# Patient Record
Sex: Male | Born: 1975 | Race: White | Hispanic: No | Marital: Married | Smoking: Former smoker
Health system: Southern US, Community
[De-identification: ages and names within clinical notes are randomized; demographics above are authoritative.]

## PROBLEM LIST (undated history)

## (undated) DIAGNOSIS — L309 Dermatitis, unspecified: Secondary | ICD-10-CM

## (undated) DIAGNOSIS — M109 Gout, unspecified: Secondary | ICD-10-CM

## (undated) DIAGNOSIS — E669 Obesity, unspecified: Secondary | ICD-10-CM

## (undated) DIAGNOSIS — Z8669 Personal history of other diseases of the nervous system and sense organs: Secondary | ICD-10-CM

## (undated) DIAGNOSIS — E781 Pure hyperglyceridemia: Secondary | ICD-10-CM

## (undated) DIAGNOSIS — I493 Ventricular premature depolarization: Secondary | ICD-10-CM

## (undated) HISTORY — DX: Gout, unspecified: M10.9

## (undated) HISTORY — DX: Obesity, unspecified: E66.9

## (undated) HISTORY — DX: Personal history of other diseases of the nervous system and sense organs: Z86.69

## (undated) HISTORY — DX: Ventricular premature depolarization: I49.3

## (undated) HISTORY — PX: BACK SURGERY: SHX140

## (undated) HISTORY — DX: Dermatitis, unspecified: L30.9

## (undated) HISTORY — DX: Pure hyperglyceridemia: E78.1

---
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
## 1998-12-09 ENCOUNTER — Encounter: Payer: Self-pay | Admitting: Emergency Medicine

## 1998-12-09 ENCOUNTER — Emergency Department (HOSPITAL_COMMUNITY): Admission: EM | Admit: 1998-12-09 | Discharge: 1998-12-09 | Payer: Self-pay | Admitting: Emergency Medicine

## 1999-05-24 ENCOUNTER — Emergency Department (HOSPITAL_COMMUNITY): Admission: EM | Admit: 1999-05-24 | Discharge: 1999-05-24 | Payer: Self-pay

## 2001-07-08 HISTORY — PX: CARDIOVASCULAR STRESS TEST: SHX262

## 2001-08-08 DIAGNOSIS — E781 Pure hyperglyceridemia: Secondary | ICD-10-CM | POA: Insufficient documentation

## 2001-09-10 ENCOUNTER — Encounter: Payer: Self-pay | Admitting: Family Medicine

## 2001-09-10 ENCOUNTER — Encounter: Admission: RE | Admit: 2001-09-10 | Discharge: 2001-09-10 | Payer: Self-pay | Admitting: Family Medicine

## 2004-06-11 ENCOUNTER — Ambulatory Visit: Payer: Self-pay | Admitting: Family Medicine

## 2004-09-10 ENCOUNTER — Ambulatory Visit: Payer: Self-pay | Admitting: Family Medicine

## 2005-02-08 ENCOUNTER — Ambulatory Visit: Payer: Self-pay | Admitting: Family Medicine

## 2005-06-28 ENCOUNTER — Ambulatory Visit: Payer: Self-pay | Admitting: Family Medicine

## 2007-06-29 ENCOUNTER — Emergency Department (HOSPITAL_COMMUNITY): Admission: EM | Admit: 2007-06-29 | Discharge: 2007-06-29 | Payer: Self-pay | Admitting: Emergency Medicine

## 2007-09-09 ENCOUNTER — Ambulatory Visit: Payer: Self-pay | Admitting: Family Medicine

## 2007-09-09 DIAGNOSIS — B9789 Other viral agents as the cause of diseases classified elsewhere: Secondary | ICD-10-CM

## 2008-04-19 ENCOUNTER — Telehealth (INDEPENDENT_AMBULATORY_CARE_PROVIDER_SITE_OTHER): Payer: Self-pay | Admitting: Internal Medicine

## 2008-05-05 ENCOUNTER — Ambulatory Visit: Payer: Self-pay | Admitting: Family Medicine

## 2008-09-08 ENCOUNTER — Ambulatory Visit: Payer: Self-pay | Admitting: Family Medicine

## 2008-09-08 DIAGNOSIS — K219 Gastro-esophageal reflux disease without esophagitis: Secondary | ICD-10-CM | POA: Insufficient documentation

## 2008-09-22 ENCOUNTER — Telehealth (INDEPENDENT_AMBULATORY_CARE_PROVIDER_SITE_OTHER): Payer: Self-pay | Admitting: Internal Medicine

## 2008-09-26 ENCOUNTER — Encounter (INDEPENDENT_AMBULATORY_CARE_PROVIDER_SITE_OTHER): Payer: Self-pay | Admitting: Internal Medicine

## 2008-11-18 IMAGING — CR DG WRIST COMPLETE 3+V*R*
2 series · 2 of 2 positions shown · non-contrast
Comparison: none

CLINICAL DATA: 31-year-old, fell and injured wrist. 
 RIGHT WRIST - 4 VIEW:

[view not recorded (1 of 2)]
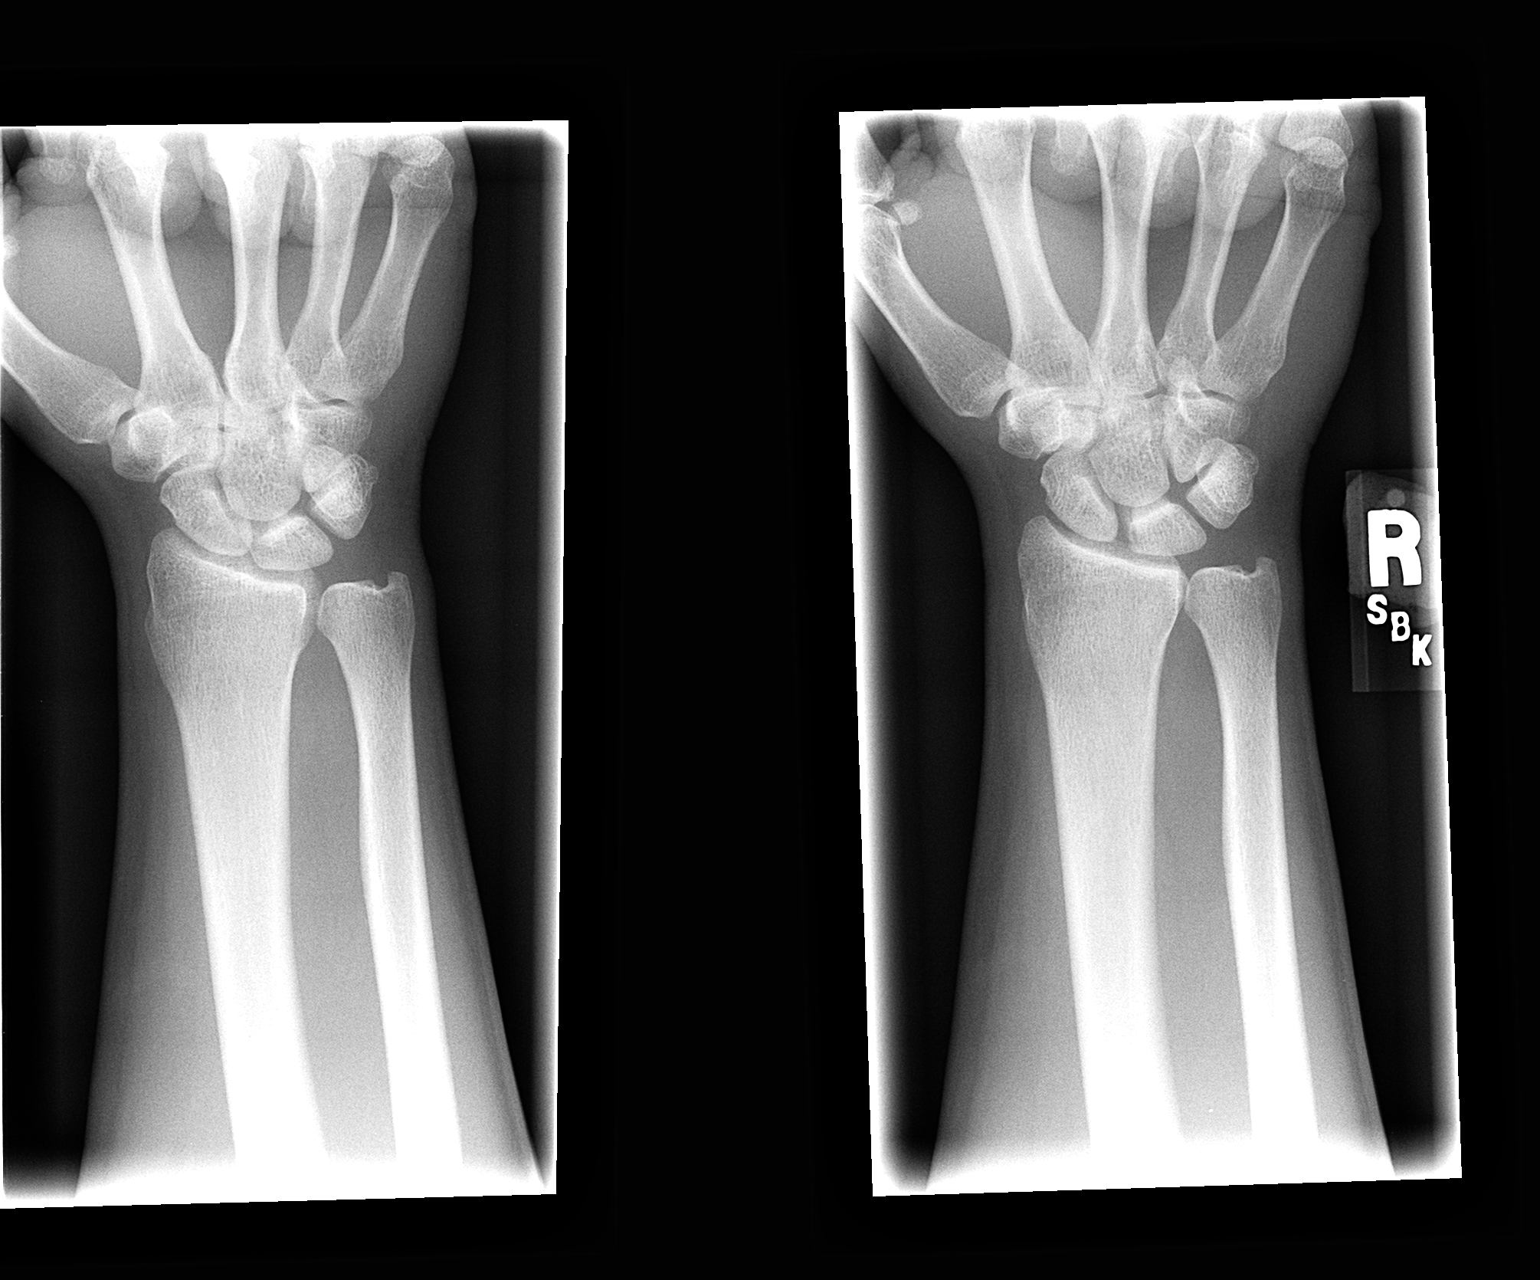

[view not recorded (2 of 2)]
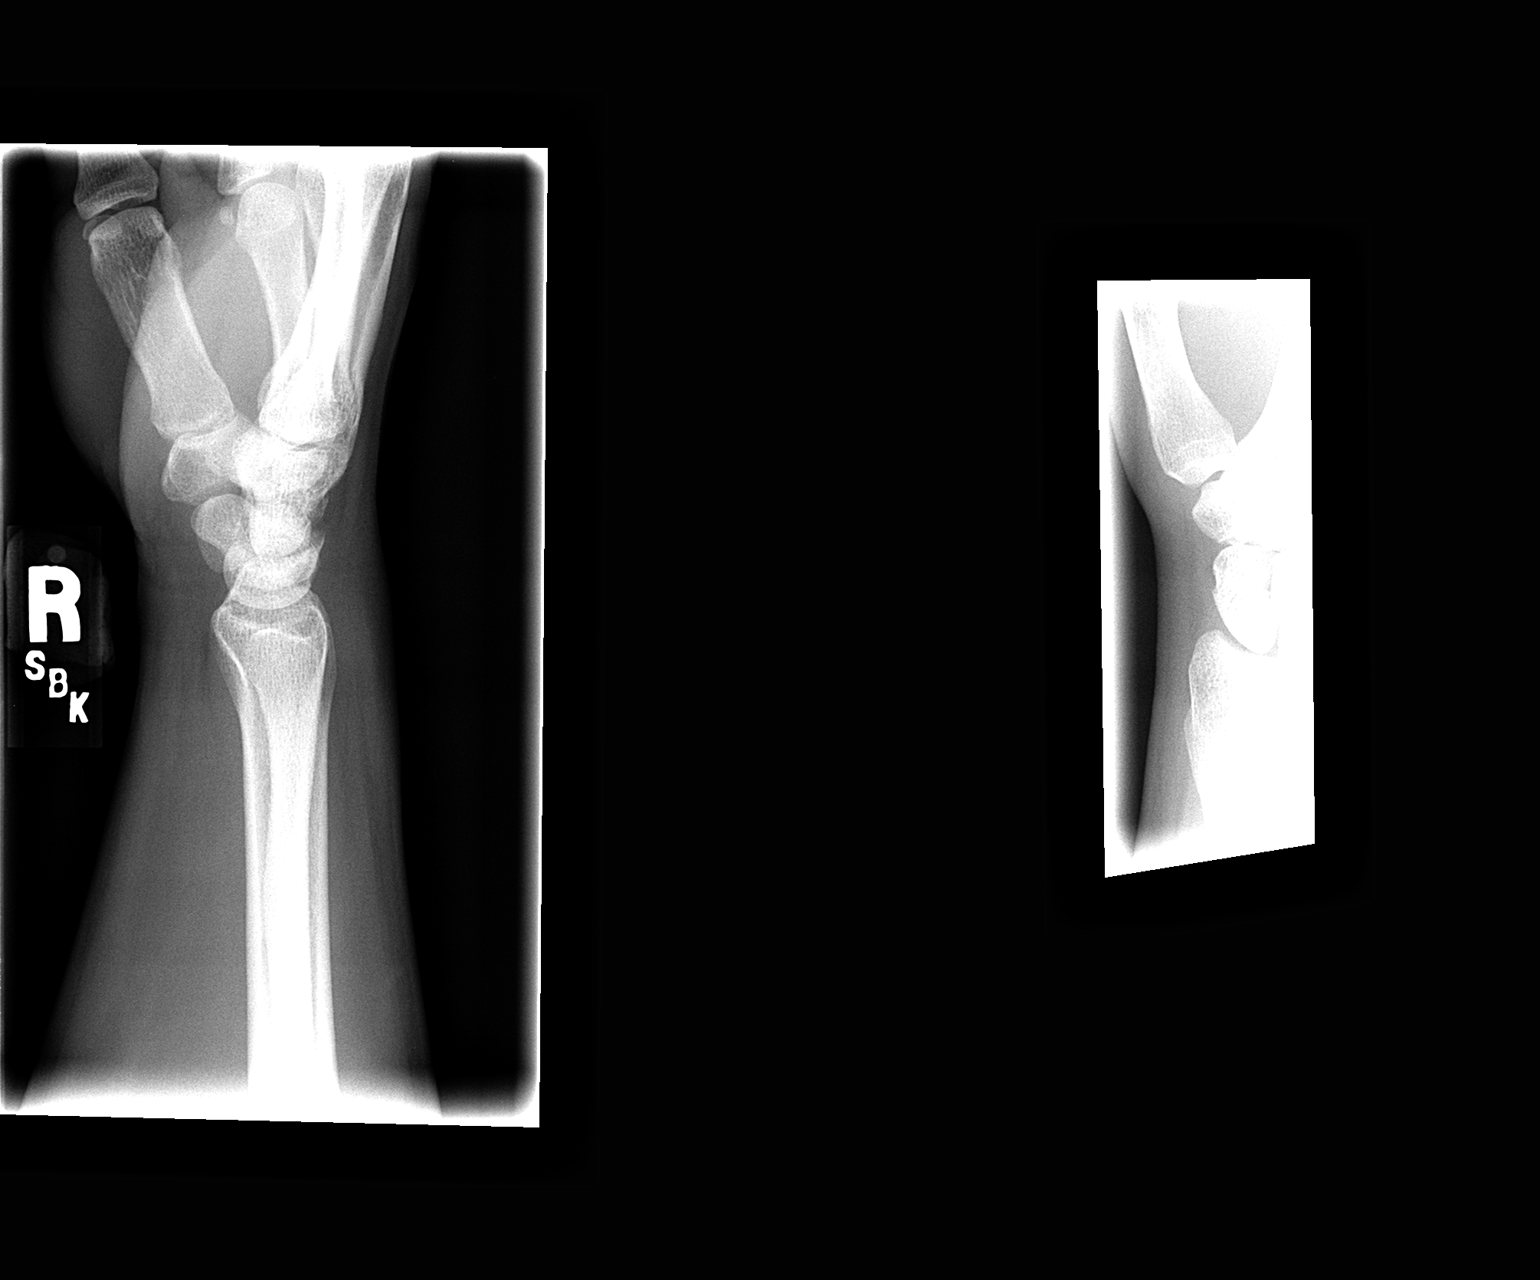

[2 of 2 positions shown; findings below may reference images not displayed]

FINDINGS: There is no evidence of fracture or dislocation.  There is no evidence of arthropathy or other focal bone abnormality.  Soft tissues are unremarkable.
IMPRESSION: Negative.

## 2010-11-23 ENCOUNTER — Inpatient Hospital Stay (INDEPENDENT_AMBULATORY_CARE_PROVIDER_SITE_OTHER)
Admission: RE | Admit: 2010-11-23 | Discharge: 2010-11-23 | Disposition: A | Discharge status: Home / Self Care | Payer: BC Managed Care – HMO | Source: Ambulatory Visit | Attending: Emergency Medicine | Admitting: Emergency Medicine

## 2010-11-23 DIAGNOSIS — T6391XA Toxic effect of contact with unspecified venomous animal, accidental (unintentional), initial encounter: Secondary | ICD-10-CM

## 2011-07-17 ENCOUNTER — Ambulatory Visit (INDEPENDENT_AMBULATORY_CARE_PROVIDER_SITE_OTHER): Payer: Self-pay | Admitting: Cardiovascular Disease

## 2011-07-17 ENCOUNTER — Encounter: Payer: Self-pay | Admitting: Cardiovascular Disease

## 2011-07-17 VITALS — BP 127/81 | HR 83 | Ht 68.0 in | Wt 216.0 lb

## 2011-07-17 DIAGNOSIS — R079 Chest pain, unspecified: Secondary | ICD-10-CM | POA: Insufficient documentation

## 2011-07-17 DIAGNOSIS — Z733 Stress, not elsewhere classified: Secondary | ICD-10-CM

## 2011-07-17 DIAGNOSIS — F439 Reaction to severe stress, unspecified: Secondary | ICD-10-CM | POA: Insufficient documentation

## 2011-07-17 DIAGNOSIS — R000 Tachycardia, unspecified: Secondary | ICD-10-CM | POA: Insufficient documentation

## 2011-07-17 MED ORDER — NITROGLYCERIN 0.4 MG SL SUBL: 0.4000 mg | SUBLINGUAL_TABLET | SUBLINGUAL | Status: DC | PRN

## 2011-07-17 NOTE — Progress Notes (Signed)
Addended by: Phoebe Sharps on: 07/17/2011 06:43 PM   Modules accepted: Level of Service

## 2011-07-17 NOTE — Patient Instructions (Signed)
Please try bystolic 10 mg (or a half) for faster heart rate and high blood pressure, as needed. Try nitro under the tongue as needed for chest pain and arm pain.  Ok to take with xanax.  Please call us if you have new issues that need to be addressed before your next appt.    \

## 2011-07-17 NOTE — Progress Notes (Signed)
   Patient ID: BERNADETTE ARMIJO, male    DOB: 1976/04/16, 36 y.o.   MRN: [id number]  HPI Comments: Mr. Glazer is a 36 year old gentleman with no known cardiac history, mild obesity who works as a Programmer, multimedia who presents for evaluation of tachycardia, chest pain and hypertension.  He believes his symptoms are secondary to stress. He reports that work is very stressful currently as the Data processing manager are criticizing his Electrical engineer. They're finding numerous paperwork issues and other issues and his boss is threatening his job.  He also reports having significant issues at home with his wife. He has had marital counseling before. They fight frequently and he is tired of fighting. They do have a six-year-old son.  He has had episodes of chest pain at work,"feeling funny" presenting to the far department. Blood pressure and EKGs were essentially normal. Symptoms seem to happen with stress.  EKG today shows normal sinus rhythm with rate 86 beats per minute with no significant ST or T wave changes   Meds: Not on medications   Review of Systems  Constitutional: Negative.   HENT: Negative.   Eyes: Negative.   Respiratory: Positive for shortness of breath.   Cardiovascular: Positive for chest pain.  Gastrointestinal: Negative.   Musculoskeletal: Negative.   Skin: Negative.   Neurological: Negative.   Hematological: Negative.   Psychiatric/Behavioral: Negative.   All other systems reviewed and are negative.    BP 127/81  Pulse 83  Ht 5\' 8"  (1.727 m)  Wt 216 lb (97.977 kg)  BMI 32.84 kg/m2  Physical Exam  Nursing note and vitals reviewed. Constitutional: He is oriented to person, place, and time. He appears well-developed and well-nourished.  HENT:  Head: Normocephalic.  Nose: Nose normal.  Mouth/Throat: Oropharynx is clear and moist.  Eyes: Conjunctivae are normal. Pupils are equal, round, and reactive to light.  Neck: Normal range of motion. Neck supple. No JVD  present.  Cardiovascular: Normal rate, regular rhythm, S1 normal, S2 normal, normal heart sounds and intact distal pulses.  Exam reveals no gallop and no friction rub.   No murmur heard. Pulmonary/Chest: Effort normal and breath sounds normal. No respiratory distress. He has no wheezes. He has no rales. He exhibits no tenderness.  Abdominal: Soft. Bowel sounds are normal. He exhibits no distension. There is no tenderness.  Musculoskeletal: Normal range of motion. He exhibits no edema and no tenderness.  Lymphadenopathy:    He has no cervical adenopathy.  Neurological: He is alert and oriented to person, place, and time. Coordination normal.  Skin: Skin is warm and dry. No rash noted. No erythema.  Psychiatric: He has a normal mood and affect. His behavior is normal. Judgment and thought content normal.           Assessment and Plan

## 2011-07-17 NOTE — Assessment & Plan Note (Signed)
Significant stress at work and at home. He is threatening to leave his wife if the arguments persists. He is stuck in his job despite looking for other jobs. He does have Xanax at home from a previous physician. We have suggested that if he has difficulty unwinding or sleeping, he could take a Xanax.

## 2011-07-17 NOTE — Assessment & Plan Note (Addendum)
Symptoms are most consistent with stress. He is young, no significant EKG changes, symptoms are very atypical in nature. He has stress at work and at home. We have suggested he could try nitroglycerin for severe chest pain radiating to his arm as he has had on more than one occasion. Symptom could be secondary to anxiety, stress, severe hypertension. Nitroglycerin sublingual may help alleviate his symptoms.  No cardiac testing is needed at this time

## 2011-07-17 NOTE — Assessment & Plan Note (Signed)
He does report having tachycardia likely secondary to stress. He does have a wrist monitor and records are reviewed in the 90-100 range on a regular basis. He is interested in medication to lower his heart rate. We have suggested he could try some samples of bystolic 5 mg, possibly titrating to 10 mg if tolerated, with close monitoring of his blood pressure.

## 2011-12-29 ENCOUNTER — Ambulatory Visit (INDEPENDENT_AMBULATORY_CARE_PROVIDER_SITE_OTHER): Payer: BC Managed Care – PPO | Admitting: Emergency Medicine

## 2011-12-29 VITALS — BP 138/74 | HR 86 | Temp 98.6°F | Resp 16 | Ht 68.5 in | Wt 222.0 lb

## 2011-12-29 DIAGNOSIS — J40 Bronchitis, not specified as acute or chronic: Secondary | ICD-10-CM

## 2011-12-29 DIAGNOSIS — J018 Other acute sinusitis: Secondary | ICD-10-CM

## 2011-12-29 MED ORDER — AMOXICILLIN-POT CLAVULANATE 875-125 MG PO TABS: 1.0000 | ORAL_TABLET | Freq: Two times a day (BID) | ORAL | Status: AC

## 2011-12-29 MED ORDER — PSEUDOEPHEDRINE-GUAIFENESIN ER 120-1200 MG PO TB12: 1.0000 | ORAL_TABLET | Freq: Two times a day (BID) | ORAL | Status: DC

## 2011-12-29 MED ORDER — HYDROCOD POLST-CHLORPHEN POLST 10-8 MG/5ML PO LQCR: 5.0000 mL | Freq: Two times a day (BID) | ORAL | Status: DC | PRN

## 2011-12-29 NOTE — Progress Notes (Signed)
Patient Name: Dylan Oliver Date of Birth: 1975/12/04 Medical Record Number: [id number] Gender: male Date of Encounter: 12/29/2011  History of Present Illness:  Dylan Oliver is a 36 y.o. very pleasant male patient who presents with the following:  Cough productive scant purulent sputum.  No fever or chills.  Post nasal drainage, foul tasting.  Pressure in maxillary sinus and congestion.  No nasal discharge.    Patient Active Problem List  Diagnosis  . VIRAL INFECTION  . HYPERLIPIDEMIA  . GERD  . Chest pain  . Tachycardia  . Stress   Past Medical History  Diagnosis Date  . Palpitations   . PVC's (premature ventricular contractions)   . Hypertriglyceridemia   . Obesity   . Family history of ischemic heart disease    Past Surgical History  Procedure Date  . Appendectomy    History  Substance Use Topics  . Smoking status: Former Smoker -- 0.5 packs/day for 4 years    Types: Cigarettes    Quit date: 07/16/1997  . Smokeless tobacco: Current User  . Alcohol Use: No   Family History  Problem Relation Age of Onset  . Hypertension Father   . Hypertension Mother   . Diabetes Father    No Known Allergies  Medication list has been reviewed and updated.  Prior to Admission medications   Medication Sig Start Date End Date Taking? Authorizing Provider  amoxicillin-clavulanate (AUGMENTIN) 875-125 MG per tablet Take 1 tablet by mouth 2 (two) times daily. 12/29/11 01/08/12  Phillips Odor, MD  chlorpheniramine-HYDROcodone (TUSSIONEX PENNKINETIC ER) 10-8 MG/5ML LQCR Take 5 mLs by mouth every 12 (twelve) hours as needed (cough). 12/29/11   Phillips Odor, MD  esomeprazole (NEXIUM) 40 MG capsule Take 40 mg by mouth daily before breakfast.    Historical Provider, MD  IBUPROFEN PO Take by mouth as needed.    Historical Provider, MD  nebivolol (BYSTOLIC) 10 MG tablet Take 1 tablet (10 mg total) by mouth daily. 07/17/11   Antonieta Iba, MD  nitroGLYCERIN (NITROSTAT) 0.4 MG SL  tablet Place 1 tablet (0.4 mg total) under the tongue every 5 (five) minutes as needed for chest pain. 07/17/11 07/16/12  Antonieta Iba, MD  omeprazole (PRILOSEC) 20 MG capsule Take 20 mg by mouth as needed.    Historical Provider, MD  Pseudoephedrine-Guaifenesin (MUCINEX D) [phone] MG TB12 Take 1 tablet by mouth 2 (two) times daily. 12/29/11   Phillips Odor, MD    Review of Systems:  As per HPI, otherwise negative.    Physical Examination: Filed Vitals:   12/29/11 1534  BP: 138/74  Pulse: 86  Temp: 98.6 F (37 C)  Resp: 16   Filed Vitals:   12/29/11 1534  Height: 5' 8.5" (1.74 m)  Weight: 222 lb (100.699 kg)   Body mass index is 33.26 kg/(m^2). Ideal Body Weight: Weight in (lb) to have BMI = 25: 166.5   GEN: WDWN, NAD, Non-toxic, A & O x 3 HEENT: Atraumatic, Normocephalic. Neck supple. No masses, No LAD. Ears and Nose: No external deformity. CV: RRR, No M/G/R. No JVD. No thrill. No extra heart sounds. PULM: CTA B, no wheezes, crackles, rhonchi. No retractions. No resp. distress. No accessory muscle use. ABD: S, NT, ND, +BS. No rebound. No HSM. EXTR: No c/c/e NEURO Normal gait.  PSYCH: Normally interactive. Conversant. Not depressed or anxious appearing.  Calm demeanor.    Assessment and Plan: Sinusitis and bronchitis meds Follow up as needed for new or worsened symptoms  Carmelina Dane, MD

## 2012-01-06 ENCOUNTER — Emergency Department (HOSPITAL_COMMUNITY)
Admission: EM | Admit: 2012-01-06 | Discharge: 2012-01-06 | Disposition: A | Discharge status: Home / Self Care | Payer: BC Managed Care – PPO | Source: Home / Self Care | Attending: Emergency Medicine | Admitting: Emergency Medicine

## 2012-01-06 ENCOUNTER — Encounter (HOSPITAL_COMMUNITY): Payer: Self-pay

## 2012-01-06 DIAGNOSIS — M109 Gout, unspecified: Secondary | ICD-10-CM

## 2012-01-06 MED ORDER — PREDNISONE 5 MG PO KIT: 1.0000 | PACK | Freq: Every day | ORAL | Status: DC

## 2012-01-06 MED ORDER — COLCHICINE 0.6 MG PO TABS: ORAL_TABLET | ORAL | Status: DC

## 2012-01-06 MED ORDER — METHYLPREDNISOLONE ACETATE 80 MG/ML IJ SUSP
INTRAMUSCULAR | Status: AC
  Filled 2012-01-06: qty 1

## 2012-01-06 MED ORDER — METHYLPREDNISOLONE ACETATE 80 MG/ML IJ SUSP
80.0000 mg | Freq: Once | INTRAMUSCULAR | Status: AC
  Administered 2012-01-06: 80 mg via INTRAMUSCULAR

## 2012-01-06 NOTE — Discharge Instructions (Signed)
Dietary treatment plays a supplementary role in treatment of gout.  Diet can be expected to decrease the uric acid level by 10 to 15%.  Often medication can effect a much more substantial reduction.  An extremely restrictive diet is not necessary, but here are a few suggestions that might help decrease the frequency of gout attacks.  The first and most important measure is to achieve and maintain a healthy body weight (BMI of 20 to 25).  It's been shown that people with a BMI over 25 have an increased risk of gout attacks compared to people with a BMI of less than 25.  Also, gout patients who lose as little as 4.5 kg or 9.9 lbs will decrease their risk of gout attacks.  Beyond that, here are a few general guidelines:  Eat less: Red meat Seafood Beer and hard alcohol (e.g. gin, vodka, whiskey) Foods that contain high fructose corn syrup (found in sweets and non-diet sodas) Organ meats (liver, kidneys, brains, sweetbreads) or foods made from these meats (hot dogs, bologna).  Eat more: Low fat dairy products A moderate amount of wine (up to two 5 oz servings per day) is not likely to increase the risk of a gout attack. Coffee may decrease the risk of gout attacks Vitamin C (500 mg per day) has a mild urate lowering effect   Gout Gout is an inflammatory condition (arthritis) caused by a buildup of uric acid crystals in the joints. Uric acid is a chemical that is normally present in the blood. Under some circumstances, uric acid can form into crystals in your joints. This causes joint redness, soreness, and swelling (inflammation). Repeat attacks are common. Over time, uric acid crystals can form into masses (tophi) near a joint, causing disfigurement. Gout is treatable and often preventable. CAUSES  The disease begins with elevated levels of uric acid in the blood. Uric acid is produced by your body when it breaks down a naturally found substance called purines. This also happens when you eat  certain foods such as meats and fish. Causes of an elevated uric acid level include:  Being passed down from parent to child (heredity).   Diseases that cause increased uric acid production (obesity, psoriasis, some cancers).   Excessive alcohol use.   Diet, especially diets rich in meat and seafood.   Medicines, including certain cancer-fighting drugs (chemotherapy), diuretics, and aspirin.   Chronic kidney disease. The kidneys are no longer able to remove uric acid well.   Problems with metabolism.  Conditions strongly associated with gout include:  Obesity.   High blood pressure.   High cholesterol.   Diabetes.  Not everyone with elevated uric acid levels gets gout. It is not understood why some people get gout and others do not. Surgery, joint injury, and eating too much of certain foods are some of the factors that can lead to gout. SYMPTOMS   An attack of gout comes on quickly. It causes intense pain with redness, swelling, and warmth in a joint.   Fever can occur.   Often, only one joint is involved. Certain joints are more commonly involved:   Base of the big toe.   Knee.   Ankle.   Wrist.   Finger.  Without treatment, an attack usually goes away in a few days to weeks. Between attacks, you usually will not have symptoms, which is different from many other forms of arthritis. DIAGNOSIS  Your caregiver will suspect gout based on your symptoms and exam. Removal of fluid  from the joint (arthrocentesis) is done to check for uric acid crystals. Your caregiver will give you a medicine that numbs the area (local anesthetic) and use a needle to remove joint fluid for exam. Gout is confirmed when uric acid crystals are seen in joint fluid, using a special microscope. Sometimes, blood, urine, and X-ray tests are also used. TREATMENT  There are 2 phases to gout treatment: treating the sudden onset (acute) attack and preventing attacks (prophylaxis). Treatment of an Acute  Attack  Medicines are used. These include anti-inflammatory medicines or steroid medicines.   An injection of steroid medicine into the affected joint is sometimes necessary.   The painful joint is rested. Movement can worsen the arthritis.   You may use warm or cold treatments on painful joints, depending which works best for you.   Discuss the use of coffee, vitamin C, or cherries with your caregiver. These may be helpful treatment options.  Treatment to Prevent Attacks After the acute attack subsides, your caregiver may advise prophylactic medicine. These medicines either help your kidneys eliminate uric acid from your body or decrease your uric acid production. You may need to stay on these medicines for a very long time. The early phase of treatment with prophylactic medicine can be associated with an increase in acute gout attacks. For this reason, during the first few months of treatment, your caregiver may also advise you to take medicines usually used for acute gout treatment. Be sure you understand your caregiver's directions. You should also discuss dietary treatment with your caregiver. Certain foods such as meats and fish can increase uric acid levels. Other foods such as dairy can decrease levels. Your caregiver can give you a list of foods to avoid. HOME CARE INSTRUCTIONS   Do not take aspirin to relieve pain. This raises uric acid levels.   Only take over-the-counter or prescription medicines for pain, discomfort, or fever as directed by your caregiver.   Rest the joint as much as possible. When in bed, keep sheets and blankets off painful areas.   Keep the affected joint raised (elevated).   Use crutches if the painful joint is in your leg.   Drink enough water and fluids to keep your urine clear or pale yellow. This helps your body get rid of uric acid. Do not drink alcoholic beverages. They slow the passage of uric acid.   Follow your caregiver's dietary instructions.  Pay careful attention to the amount of protein you eat. Your daily diet should emphasize fruits, vegetables, whole grains, and fat-free or low-fat milk products.   Maintain a healthy body weight.  SEEK MEDICAL CARE IF:   You have an oral temperature above 102 F (38.9 C).   You develop diarrhea, vomiting, or any side effects from medicines.   You do not feel better in 24 hours, or you are getting worse.  SEEK IMMEDIATE MEDICAL CARE IF:   Your joint becomes suddenly more tender and you have:   Chills.   An oral temperature above 102 F (38.9 C), not controlled by medicine.  MAKE SURE YOU:   Understand these instructions.   Will watch your condition.   Will get help right away if you are not doing well or get worse.  Document Released: 06/21/2000 Document Revised: 06/13/2011 Document Reviewed: 10/02/2009 [facility] Patient Information 2012 [place], [place].

## 2012-01-06 NOTE — ED Provider Notes (Signed)
Chief Complaint  Patient presents with  . Foot Pain    History of Present Illness:   Dylan Oliver is a 36 year old male who has had a three-day history of waxing and waning pain over the MTP joint of the right great toe. There is swelling and redness. Sometimes it hurts to walk or wear a shoe and he denies any injury to the area. No fever or chills. No prior history of similar attacks in the past. He denies any history of gout or family history of gout. He does drink lots of regular Mountain Dew sodas.  Review of Systems:  Other than noted above, the patient denies any of the following symptoms: Systemic:  No fevers, chills, sweats, or aches.  No fatigue or tiredness. Musculoskeletal:  No joint pain, arthritis, bursitis, swelling, back pain, or neck pain. Neurological:  No muscular weakness, paresthesias, headache, or trouble with speech or coordination.  No dizziness.   PMFSH:  Past medical history, family history, social history, meds, and allergies were reviewed.  Physical Exam:   Vital signs:  BP 124/82  Pulse 69  Temp 98.1 F (36.7 C) (Oral)  Resp 16  SpO2 99% Gen:  Alert and oriented times 3.  In no distress. Musculoskeletal: There was redness, swelling, and pain to palpation over the MTP joint of the right great toe. Otherwise, all joints had a full a ROM with no swelling, bruising or deformity.  No edema, pulses full. Extremities were warm and pink.  Capillary refill was brisk.  Skin:  Clear, warm and dry.  No rash. Neuro:  Alert and oriented times 3.  Muscle strength was normal.  Sensation was intact to light touch.   Other Labs Obtained at Urgent Care Center:  A uric acid levels obtained.  Results are pending at this time and we will call about any positive results.  Course in Urgent Care Center:   He was given Depo-Medrol 80 mg IM and tolerated this well without any immediate side effects.  Assessment:  The encounter diagnosis was Gout.  Plan:   1.  The following meds were  prescribed:   New Prescriptions   COLCHICINE 0.6 MG TABLET    Take 2 now and 1 in 1 hour.  May repeat dose once daily.  For gout attack.   PREDNISONE 5 MG KIT    Take 1 kit (5 mg total) by mouth daily after breakfast. Prednisone 5 mg 6 day dosepack.  Take as directed.   2.  The patient was instructed in symptomatic care, including rest and activity, elevation, application of ice and compression.  Appropriate handouts were given. 3.  The patient was told to return if becoming worse in any way, if no better in 3 or 4 days, and given some red flag symptoms that would indicate earlier return.   4.  The patient was told to follow up with a primary care physician of his choice with regard to uric acid lowering therapy.   Reuben Likes, MD 01/06/12 2032

## 2012-01-06 NOTE — ED Notes (Signed)
C/o pain in plantar surface of rt foot at the base of rt great toe.  Sx started on Saturday.  Denies injury.

## 2012-01-07 ENCOUNTER — Telehealth (HOSPITAL_COMMUNITY): Payer: Self-pay | Admitting: *Deleted

## 2012-01-07 NOTE — ED Notes (Signed)
Uric Acid 8.2 H.  Dr. Lorenz Coaster has reviewed.  I called pt. Pt. verified x 2 and given result.  Pt. told this indicates gout and he was adequately treated with the Colchicine and Prednisone. Pt. instructed to get a PCP, so he can get on long term treatment with medication that will lower his Uric Acid level and decrease outbreaks of gout. Pt. voiced understanding. Vassie Moselle 01/07/2012

## 2012-01-30 ENCOUNTER — Telehealth: Payer: Self-pay | Admitting: Family Medicine

## 2012-01-30 NOTE — Telephone Encounter (Signed)
Yes may place him in physical slot sooner.

## 2012-01-30 NOTE — Telephone Encounter (Signed)
Pt was recently diagnosed with gout and went to an urgent care b/c he was going to [facility] and he did see Everrett Coombe here at our office before that. He wanted to know if he could est with you because you were recommenced to him. He wanted to know if he could see you before your next new pt appt in late september early October.

## 2012-01-30 NOTE — Telephone Encounter (Signed)
Appt scheduled with patient

## 2012-02-10 ENCOUNTER — Ambulatory Visit: Payer: BC Managed Care – PPO | Admitting: Family Medicine

## 2012-02-17 ENCOUNTER — Other Ambulatory Visit: Payer: Self-pay | Admitting: Family Medicine

## 2012-02-17 ENCOUNTER — Encounter: Payer: Self-pay | Admitting: Radiology

## 2012-02-17 ENCOUNTER — Encounter: Payer: Self-pay | Admitting: Family Medicine

## 2012-02-17 ENCOUNTER — Ambulatory Visit (INDEPENDENT_AMBULATORY_CARE_PROVIDER_SITE_OTHER): Payer: BC Managed Care – PPO | Admitting: Family Medicine

## 2012-02-17 VITALS — BP 118/76 | HR 68 | Temp 97.8°F | Ht 68.0 in | Wt 225.0 lb

## 2012-02-17 DIAGNOSIS — E785 Hyperlipidemia, unspecified: Secondary | ICD-10-CM

## 2012-02-17 DIAGNOSIS — M109 Gout, unspecified: Secondary | ICD-10-CM

## 2012-02-17 DIAGNOSIS — Z23 Encounter for immunization: Secondary | ICD-10-CM

## 2012-02-17 DIAGNOSIS — S61219A Laceration without foreign body of unspecified finger without damage to nail, initial encounter: Secondary | ICD-10-CM

## 2012-02-17 DIAGNOSIS — S61209A Unspecified open wound of unspecified finger without damage to nail, initial encounter: Secondary | ICD-10-CM

## 2012-02-17 LAB — HEPATIC FUNCTION PANEL
ALT: 71 U/L — ABNORMAL HIGH (ref 0–53)
AST: 28 U/L (ref 0–37)
Alkaline Phosphatase: 50 U/L (ref 39–117)
Bilirubin, Direct: 0 mg/dL (ref 0.0–0.3)
Total Bilirubin: 0.5 mg/dL (ref 0.3–1.2)
Total Protein: 7.2 g/dL (ref 6.0–8.3)

## 2012-02-17 LAB — BASIC METABOLIC PANEL
CO2: 27 mEq/L (ref 19–32)
Calcium: 8.7 mg/dL (ref 8.4–10.5)
Chloride: 103 mEq/L (ref 96–112)
Glucose, Bld: 93 mg/dL (ref 70–99)
Sodium: 139 mEq/L (ref 135–145)

## 2012-02-17 LAB — LIPID PANEL
Cholesterol: 182 mg/dL (ref 0–200)
Total CHOL/HDL Ratio: 6
Triglycerides: 595 mg/dL — ABNORMAL HIGH (ref 0.0–149.0)

## 2012-02-17 MED ORDER — FENOFIBRATE 145 MG PO TABS: 145.0000 mg | ORAL_TABLET | Freq: Every day | ORAL | Status: DC

## 2012-02-17 NOTE — Assessment & Plan Note (Signed)
Fasting - check FLP today.

## 2012-02-17 NOTE — Addendum Note (Signed)
Addended by: Josph Macho A on: 02/17/2012 09:20 AM   Modules accepted: Orders

## 2012-02-17 NOTE — Assessment & Plan Note (Signed)
Small incision. Site cleaned with betadine, then benzoin/steri strips applied. Pt tolerated well. Red flags to return discussed.

## 2012-02-17 NOTE — Progress Notes (Signed)
Subjective:    Patient ID: Dylan Oliver, male    DOB: 1975/08/17, 36 y.o.   MRN: [id number]  HPI CC: new pt  Dylan Oliver is a pleasant 36 yo who presents today to establish care.  He prior saw [facility] then [facility].   Reviewed Dr. Ethelene Hal note from 07/2011.  At that time, significant work related and marital related stress.  Had xanax he used prn sleep/anxiety.  H/o chest pain, s/p evaluation 07/2011 by cards thought to be stress related, recommended try SL nitro prn pain.  Actually changed jobs, improved stress. H/o tachycardia, recommended use bystolic if needed for this.  Recently evaluated at [facility] with R MTP pain, dx with gout with uric acid elevated at 8.2.  Started on colchicine for acute flares.  Cut finger with hack saw last night.  Requests tetanus shot.  Bled for a bit, stopped with pressure.  Lives with wife, son Occupation: salesman  Medications and allergies reviewed and updated in chart.  Past histories reviewed and updated if relevant as below. Patient Active Problem List  Diagnosis  . VIRAL INFECTION  . HYPERLIPIDEMIA  . GERD  . Chest pain  . Tachycardia  . Stress   Past Medical History  Diagnosis Date  . PVC's (premature ventricular contractions)   . Hypertriglyceridemia   . Obesity   . Family history of ischemic heart disease   . Hx of migraines   . Gout    Past Surgical History  Procedure Date  . Appendectomy 1993  . Back surgery     what sounds like pilonidal cyst extraction   History  Substance Use Topics  . Smoking status: Former Smoker -- 0.5 packs/day for 4 years    Types: Cigarettes    Quit date: 07/16/1997  . Smokeless tobacco: Current User    Types: Snuff  . Alcohol Use: No   Family History  Problem Relation Age of Onset  . Hypertension Father   . Hypertension Mother   . Diabetes Father   . Coronary artery disease Maternal Grandfather 51  . Stroke Neg Hx   . Cancer Mother     skin   No Known Allergies Current  Outpatient Prescriptions on File Prior to Visit  Medication Sig Dispense Refill  . colchicine 0.6 MG tablet Take 2 now and 1 in 1 hour.  May repeat dose once daily.  For gout attack.  12 tablet  0     Review of Systems  Constitutional: Negative for fever, chills, activity change, appetite change, fatigue and unexpected weight change.  HENT: Negative for hearing loss and neck pain.   Eyes: Negative for visual disturbance.  Respiratory: Negative for cough, chest tightness, shortness of breath and wheezing.   Cardiovascular: Negative for chest pain, palpitations and leg swelling.  Gastrointestinal: Negative for nausea, vomiting, abdominal pain, diarrhea, constipation, blood in stool and abdominal distention.  Genitourinary: Negative for hematuria and difficulty urinating.  Musculoskeletal: Negative for myalgias and arthralgias.  Skin: Negative for rash.  Neurological: Negative for dizziness, seizures, syncope and headaches.  Hematological: Does not bruise/bleed easily.  Psychiatric/Behavioral: Negative for dysphoric mood. The patient is not nervous/anxious.        Objective:   Physical Exam  Nursing note and vitals reviewed. Constitutional: He is oriented to person, place, and time. He appears well-developed and well-nourished. No distress.  HENT:  Head: Normocephalic and atraumatic.  Right Ear: External ear normal.  Left Ear: External ear normal.  Nose: Nose normal.  Mouth/Throat:  Oropharynx is clear and moist. No oropharyngeal exudate.  Eyes: Conjunctivae and EOM are normal. Pupils are equal, round, and reactive to light. No scleral icterus.  Neck: Normal range of motion. Neck supple.  Cardiovascular: Normal rate, regular rhythm, normal heart sounds and intact distal pulses.   No murmur heard. Pulses:      Radial pulses are 2+ on the right side, and 2+ on the left side.  Pulmonary/Chest: Effort normal and breath sounds normal. No respiratory distress. He has no wheezes. He has no  rales.  Abdominal: Soft. Bowel sounds are normal. He exhibits no distension and no mass. There is no tenderness. There is no rebound and no guarding.  Musculoskeletal: Normal range of motion.       R index finger at palmar PIP joint with 1cm lac, no bleeding Able to flex/extend at joints  Lymphadenopathy:    He has no cervical adenopathy.  Neurological: He is alert and oriented to person, place, and time.       CN grossly intact, station and gait intact  Skin: Skin is warm and dry. No rash noted.  Psychiatric: He has a normal mood and affect. His behavior is normal. Judgment and thought content normal.       Assessment & Plan:

## 2012-02-17 NOTE — Patient Instructions (Addendum)
Good to meet you today.  Call us with questions. As you're fasting, we will check some blood work today to check cholesterol levels. Tdap today. Keep finger clean, steri strips should fall off.  Don't soak hand;.  Gout Gout is an inflammatory condition (arthritis) caused by a buildup of uric acid crystals in the joints. Uric acid is a chemical that is normally present in the blood. Under some circumstances, uric acid can form into crystals in your joints. This causes joint redness, soreness, and swelling (inflammation). Repeat attacks are common. Over time, uric acid crystals can form into masses (tophi) near a joint, causing disfigurement. Gout is treatable and often preventable. CAUSES  The disease begins with elevated levels of uric acid in the blood. Uric acid is produced by your body when it breaks down a naturally found substance called purines. This also happens when you eat certain foods such as meats and fish. Causes of an elevated uric acid level include:  Being passed down from parent to child (heredity).   Diseases that cause increased uric acid production (obesity, psoriasis, some cancers).   Excessive alcohol use.   Diet, especially diets rich in meat and seafood.   Medicines, including certain cancer-fighting drugs (chemotherapy), diuretics, and aspirin.   Chronic kidney disease. The kidneys are no longer able to remove uric acid well.   Problems with metabolism.  Conditions strongly associated with gout include:  Obesity.   High blood pressure.   High cholesterol.   Diabetes.  Not everyone with elevated uric acid levels gets gout. It is not understood why some people get gout and others do not. Surgery, joint injury, and eating too much of certain foods are some of the factors that can lead to gout. SYMPTOMS   An attack of gout comes on quickly. It causes intense pain with redness, swelling, and warmth in a joint.   Fever can occur.   Often, only one joint is  involved. Certain joints are more commonly involved:   Base of the big toe.   Knee.   Ankle.   Wrist.   Finger.  Without treatment, an attack usually goes away in a few days to weeks. Between attacks, you usually will not have symptoms, which is different from many other forms of arthritis. DIAGNOSIS  Your caregiver will suspect gout based on your symptoms and exam. Removal of fluid from the joint (arthrocentesis) is done to check for uric acid crystals. Your caregiver will give you a medicine that numbs the area (local anesthetic) and use a needle to remove joint fluid for exam. Gout is confirmed when uric acid crystals are seen in joint fluid, using a special microscope. Sometimes, blood, urine, and X-ray tests are also used. TREATMENT  There are 2 phases to gout treatment: treating the sudden onset (acute) attack and preventing attacks (prophylaxis). Treatment of an Acute Attack  Medicines are used. These include anti-inflammatory medicines or steroid medicines.   An injection of steroid medicine into the affected joint is sometimes necessary.   The painful joint is rested. Movement can worsen the arthritis.   You may use warm or cold treatments on painful joints, depending which works best for you.   Discuss the use of coffee, vitamin C, or cherries with your caregiver. These may be helpful treatment options.  Treatment to Prevent Attacks After the acute attack subsides, your caregiver may advise prophylactic medicine. These medicines either help your kidneys eliminate uric acid from your body or decrease your uric acid production.  You may need to stay on these medicines for a very long time. The early phase of treatment with prophylactic medicine can be associated with an increase in acute gout attacks. For this reason, during the first few months of treatment, your caregiver may also advise you to take medicines usually used for acute gout treatment. Be sure you understand your  caregiver's directions. You should also discuss dietary treatment with your caregiver. Certain foods such as meats and fish can increase uric acid levels. Other foods such as dairy can decrease levels. Your caregiver can give you a list of foods to avoid. HOME CARE INSTRUCTIONS   Do not take aspirin to relieve pain. This raises uric acid levels.   Only take over-the-counter or prescription medicines for pain, discomfort, or fever as directed by your caregiver.   Rest the joint as much as possible. When in bed, keep sheets and blankets off painful areas.   Keep the affected joint raised (elevated).   Use crutches if the painful joint is in your leg.   Drink enough water and fluids to keep your urine clear or pale yellow. This helps your body get rid of uric acid. Do not drink alcoholic beverages. They slow the passage of uric acid.   Follow your caregiver's dietary instructions. Pay careful attention to the amount of protein you eat. Your daily diet should emphasize fruits, vegetables, whole grains, and fat-free or low-fat milk products.   Maintain a healthy body weight.  SEEK MEDICAL CARE IF:   You have an oral temperature above 102 F (38.9 C).   You develop diarrhea, vomiting, or any side effects from medicines.   You do not feel better in 24 hours, or you are getting worse.  SEEK IMMEDIATE MEDICAL CARE IF:   Your joint becomes suddenly more tender and you have:   Chills.   An oral temperature above 102 F (38.9 C), not controlled by medicine.  MAKE SURE YOU:   Understand these instructions.   Will watch your condition.   Will get help right away if you are not doing well or get worse.  Document Released: 06/21/2000 Document Revised: 06/13/2011 Document Reviewed: 10/02/2009 [facility] Patient Information 2012 [place], [place].

## 2012-02-17 NOTE — Assessment & Plan Note (Signed)
Monitor for now, use colchicine prn. If rpt flare, start allopurinol. Discussed dietary changes and gout etiology.

## 2012-04-24 ENCOUNTER — Ambulatory Visit (INDEPENDENT_AMBULATORY_CARE_PROVIDER_SITE_OTHER): Payer: BC Managed Care – PPO

## 2012-04-24 ENCOUNTER — Telehealth: Payer: Self-pay

## 2012-04-24 VITALS — BP 110/60 | HR 66 | Ht 68.0 in | Wt 223.2 lb

## 2012-04-24 DIAGNOSIS — R002 Palpitations: Secondary | ICD-10-CM

## 2012-04-24 MED ORDER — NEBIVOLOL HCL 10 MG PO TABS: 5.0000 mg | ORAL_TABLET | Freq: Every day | ORAL | Status: DC

## 2012-04-24 NOTE — Telephone Encounter (Signed)
Pt called back Says he had an episode 2 nights ago where he felt as if his heart "stopped" then restarted He says he then felt his heart beating more irregular last night Today he feels heart is beating more regularly but states, "I just dont feel right".   He has not been taking Bystolic as prescribed by Dr. Mariah Milling in January, since May 2013 Says he ran out of samples Advised ok to come in for EKG/BP check but I explained Dr. Mariah Milling not in office to see pt Pt verb. Understanding and is ok with EKG/BP check only Coming in today at 2 pm

## 2012-04-24 NOTE — Progress Notes (Signed)
Pt here for EKG/BP check  c/o palpitations last PM and an episode of feeling as if "my heart stopped" 2 nights ago He says he has worn a holter monitor is the past (we do not have record of this) and it showed what he is describing as PVC's. He used to take Bystolic 10 mg daily, at Dr. Windell Hummingbird instruction, but ran out of samples and has been out x 5 months  EKG was NSR with a rate of 66 BPM, no PVCs BP=110/60 I reassured pt EKG is normal, BP is normal. I advised him to resume Bystolic 10 mg , take 1/2 tablet daily and f/u with Dr. Mariah Milling next week. Understanding verbalized and samples of Bystolic given to pt  He will call us sooner than appt should he develop different/worsening symptoms

## 2012-04-24 NOTE — Telephone Encounter (Signed)
Patient c/o rapid irregular heart beats with some dizziness for several days. He is feeling weak with feeling lightheaded now but heart rate feels back to being normal. He has not taken the Bystolic. He does not know his blood pressure or heart rate.  Please contact patient for what to do.

## 2012-04-24 NOTE — Telephone Encounter (Signed)
LMTCB

## 2012-04-30 ENCOUNTER — Ambulatory Visit (INDEPENDENT_AMBULATORY_CARE_PROVIDER_SITE_OTHER): Payer: BC Managed Care – PPO | Admitting: Cardiovascular Disease

## 2012-04-30 ENCOUNTER — Encounter: Payer: Self-pay | Admitting: Cardiovascular Disease

## 2012-04-30 VITALS — BP 100/62 | HR 52 | Ht 68.0 in | Wt 226.8 lb

## 2012-04-30 DIAGNOSIS — R002 Palpitations: Secondary | ICD-10-CM

## 2012-04-30 DIAGNOSIS — E785 Hyperlipidemia, unspecified: Secondary | ICD-10-CM

## 2012-04-30 NOTE — Assessment & Plan Note (Signed)
We have suggested that he work on his weight, watch his diet. He is currently on TriCor. He could recheck his lab work in 6 months time.

## 2012-04-30 NOTE — Progress Notes (Signed)
   Patient ID: Dylan Oliver, male    DOB: 05/03/1976, 36 y.o.   MRN: [id number]  HPI Comments: Dylan Oliver is a 36 year old gentleman with no known cardiac history, mild obesity, prior symptoms of tachycardia, chest pain seen in January 2013.  He had significant stress on his last clinic visit. This improved after his job change. He reports that stress has returned at work, still some stress at home. He does report several episodes last week of palpitations, a very uncomfortable feeling in his chest like his heart had stopped. Also associated with a dizzy episode. Happened at rest and with minimal exertion on several occasions.  He be started low dose bystolic with no further symptoms.   Recent lab work shows elevated triglycerides greater than 500, total cholesterol 180, LDL 90s  EKG today shows normal sinus rhythm with rate 52 beats per minute with no significant ST or T wave changes   Outpatient Encounter Prescriptions as of 04/30/2012  Medication Sig Dispense Refill  . colchicine 0.6 MG tablet Take 2 now and 1 in 1 hour.  May repeat dose once as needed.  For gout attack.      . fenofibrate (TRICOR) 145 MG tablet Take 1 tablet (145 mg total) by mouth daily.  30 tablet  12  . ibuprofen (ADVIL,MOTRIN) 600 MG tablet Take by mouth every 6 (six) hours as needed.      . nebivolol (BYSTOLIC) 10 MG tablet Take 0.5 tablets (5 mg total) by mouth daily.  21 tablet  0    Review of Systems  Constitutional: Negative.   HENT: Negative.   Eyes: Negative.   Cardiovascular: Positive for palpitations.  Gastrointestinal: Negative.   Musculoskeletal: Negative.   Skin: Negative.   Neurological: Positive for dizziness.  Hematological: Negative.   Psychiatric/Behavioral: Negative.   All other systems reviewed and are negative.   BP 100/62  Pulse 52  Ht 5\' 8"  (1.727 m)  Wt 226 lb 12 oz (102.853 kg)  BMI 34.48 kg/m2  Physical Exam  Nursing note and vitals reviewed. Constitutional: He is oriented to  person, place, and time. He appears well-developed and well-nourished.  HENT:  Head: Normocephalic.  Nose: Nose normal.  Mouth/Throat: Oropharynx is clear and moist.  Eyes: Conjunctivae normal are normal. Pupils are equal, round, and reactive to light.  Neck: Normal range of motion. Neck supple. No JVD present.  Cardiovascular: Normal rate, regular rhythm, S1 normal, S2 normal, normal heart sounds and intact distal pulses.  Exam reveals no gallop and no friction rub.   No murmur heard. Pulmonary/Chest: Effort normal and breath sounds normal. No respiratory distress. He has no wheezes. He has no rales. He exhibits no tenderness.  Abdominal: Soft. Bowel sounds are normal. He exhibits no distension. There is no tenderness.  Musculoskeletal: Normal range of motion. He exhibits no edema and no tenderness.  Lymphadenopathy:    He has no cervical adenopathy.  Neurological: He is alert and oriented to person, place, and time. Coordination normal.  Skin: Skin is warm and dry. No rash noted. No erythema.  Psychiatric: He has a normal mood and affect. His behavior is normal. Judgment and thought content normal.           Assessment and Plan

## 2012-04-30 NOTE — Assessment & Plan Note (Signed)
He has symptomatic palpitations, unable to exclude other arrhythmia. No episodes in the past week since starting low-dose beta blocker. Only several episodes of symptoms recently. We have suggested if symptoms recur, that he call our office for Holter monitor. He could pick this up in the office or this could be sent to him. He has suggested he cut his beta blocker in half and take 5 mg as his blood pressure and heart rate are low.  if he has additional symptoms, he can take the other half.

## 2012-04-30 NOTE — Patient Instructions (Addendum)
You are doing well. No medication changes were made.  Please call us if you have new issues that need to be addressed before your next appt.    

## 2012-05-06 NOTE — Patient Instructions (Addendum)
EKG is normal, BP is normal.   resume Bystolic 10 mg , take 1/2 tablet daily and f/u with Dr. Mariah Milling next week.    call us sooner than appt should he develop different/worsening symptoms

## 2012-05-06 NOTE — Addendum Note (Signed)
Addended by: Antonieta Iba on: 05/06/2012 12:37 AM   Modules accepted: Level of Service

## 2012-05-11 ENCOUNTER — Ambulatory Visit (INDEPENDENT_AMBULATORY_CARE_PROVIDER_SITE_OTHER): Payer: BC Managed Care – PPO | Admitting: Family Medicine

## 2012-05-11 ENCOUNTER — Encounter: Payer: Self-pay | Admitting: Family Medicine

## 2012-05-11 ENCOUNTER — Encounter: Payer: Self-pay | Admitting: *Deleted

## 2012-05-11 VITALS — BP 108/70 | HR 48 | Temp 98.0°F | Wt 223.2 lb

## 2012-05-11 DIAGNOSIS — J209 Acute bronchitis, unspecified: Secondary | ICD-10-CM

## 2012-05-11 DIAGNOSIS — J069 Acute upper respiratory infection, unspecified: Secondary | ICD-10-CM | POA: Insufficient documentation

## 2012-05-11 MED ORDER — GUAIFENESIN-CODEINE 100-10 MG/5ML PO SYRP: 5.0000 mL | ORAL_SOLUTION | Freq: Every evening | ORAL | Status: DC | PRN

## 2012-05-11 MED ORDER — COLCHICINE 0.6 MG PO TABS: ORAL_TABLET | ORAL | Status: DC

## 2012-05-11 NOTE — Progress Notes (Signed)
  Subjective:    Patient ID: Dylan Oliver, male    DOB: 01-03-1976, 36 y.o.   MRN: [id number]  HPI CC: cough  3-4 d h/o coughing productive of green/yellow mucous, trouble sleeping at night 2/2 cough.  Purulent mucous in AM.  Feels congested in head.  + PNdrainage leading to cough.  Coughing fits at night.  Vomited this morning - mucous.  Some dizziness from head congestion.  + HA.  + coughing fits.  Needing to sleep sitting up.  So far has tried hycodan cough syrup which did help.  Makes him too drowsy next day.  Has taken sudafed.  No fevers/chills, abd pain, ear or tooth pain, no post tussive emesis.  No sick contacts at home.  No smokers at home.  No h/o asthma.  Past Medical History  Diagnosis Date  . PVC's (premature ventricular contractions)   . Hypertriglyceridemia   . Obesity   . Hx of migraines   . Gout      Review of Systems Per HPI    Objective:   Physical Exam  Nursing note and vitals reviewed. Constitutional: He appears well-developed and well-nourished. No distress.  HENT:  Head: Normocephalic and atraumatic.  Right Ear: Hearing, tympanic membrane, external ear and ear canal normal.  Left Ear: Hearing, tympanic membrane, external ear and ear canal normal.  Nose: No mucosal edema or rhinorrhea. Right sinus exhibits maxillary sinus tenderness (mild). Right sinus exhibits no frontal sinus tenderness. Left sinus exhibits maxillary sinus tenderness (mild). Left sinus exhibits no frontal sinus tenderness.  Mouth/Throat: Uvula is midline, oropharynx is clear and moist and mucous membranes are normal. No oropharyngeal exudate, posterior oropharyngeal edema, posterior oropharyngeal erythema or tonsillar abscesses.  Eyes: Conjunctivae normal and EOM are normal. Pupils are equal, round, and reactive to light. No scleral icterus.  Neck: Normal range of motion. Neck supple.  Cardiovascular: Normal rate, regular rhythm, normal heart sounds and intact distal pulses.   No murmur  heard. Pulmonary/Chest: Effort normal and breath sounds normal. No respiratory distress. He has no wheezes. He has no rales.  Lymphadenopathy:    He has no cervical adenopathy.  Skin: Skin is warm and dry.       Beginnings of xanthelasma under bilateral eyes       Assessment & Plan:

## 2012-05-11 NOTE — Assessment & Plan Note (Signed)
Anticipate sinusitis with PNDrainage leading to irritation of bronchial passages and cough.  Given early on, anticipate more of a viral process. Update [place] if sxs persist into end of week for augmentin course. For now, treat with cheratussin and mucinex and rest/fluid. Discussed red flags to return. Pt agrees with plan.

## 2012-05-11 NOTE — Patient Instructions (Signed)
I do think you have upper respiratory infection, possibly developing sinus infection.  Given early on, anticipate more of a viral process. Note for out of work for next 2 days - return to work Thursday. Please let us know if not improving as expected or fever >101 or worsening productive cough. Use simple mucinex or immediate release guaifenesin with plenty of water to mobilize mucous. If symptoms worsening, or going on into end of this week, call me with an update.

## 2012-08-03 ENCOUNTER — Ambulatory Visit (INDEPENDENT_AMBULATORY_CARE_PROVIDER_SITE_OTHER): Payer: BC Managed Care – PPO | Admitting: Family Medicine

## 2012-08-03 ENCOUNTER — Encounter: Payer: Self-pay | Admitting: Family Medicine

## 2012-08-03 VITALS — BP 118/90 | HR 96 | Temp 98.5°F | Wt 224.5 lb

## 2012-08-03 DIAGNOSIS — J069 Acute upper respiratory infection, unspecified: Secondary | ICD-10-CM

## 2012-08-03 DIAGNOSIS — E781 Pure hyperglyceridemia: Secondary | ICD-10-CM

## 2012-08-03 MED ORDER — DOXYCYCLINE HYCLATE 100 MG PO CAPS: 100.0000 mg | ORAL_CAPSULE | Freq: Two times a day (BID) | ORAL | Status: DC

## 2012-08-03 MED ORDER — FENOFIBRATE 145 MG PO TABS: 145.0000 mg | ORAL_TABLET | Freq: Every day | ORAL | Status: DC

## 2012-08-03 MED ORDER — GUAIFENESIN-CODEINE 100-10 MG/5ML PO SYRP: 5.0000 mL | ORAL_SOLUTION | Freq: Two times a day (BID) | ORAL | Status: DC | PRN

## 2012-08-03 NOTE — Patient Instructions (Addendum)
Sounds like you have a viral sinusitis. Viral infections usually take 7-10 days to resolve.  The cough can last several weeks to go away. Use medication as prescribed: cheratussin for cough at night. Prescription for doxycycline provided in case worsening or not improving as expected. May continue simple mucinex with fluids, ibuprofen as well. Push fluids and plenty of rest. Let us know if you are not improving as expected, or if you have high fevers (>101.5) or difficulty swallowing or worsening productive cough. Call clinic with questions.  Good to see you today. Triglycerides remaining elevated - restart fenofibrate

## 2012-08-03 NOTE — Assessment & Plan Note (Addendum)
Anticipate viral URI or viral sinusitis. Treat with cough syrup at night. If progressively worsening, fill doxycycline. Refilled cheratussin.

## 2012-08-03 NOTE — Assessment & Plan Note (Addendum)
Discussed importance to treat hypertriglyceridemia. Declines cheaper gemfibrozil. Sent in 3 mo supply at a time of tricor.

## 2012-08-03 NOTE — Progress Notes (Signed)
  Subjective:    Patient ID: Dylan Oliver, male    DOB: 1976-01-25, 37 y.o.   MRN: [id number]  HPI CC: cough, congestion  5 d h/o rhinorrhea, coryza, head congestion, dry cough.  Worst night last night.  Today feeling some better.  Some muffled hearing.  + HA - R frontal pressure.  Taking mucinex so far.  No fevers/chills, abd pain, nausea, ear or tooth pain.  Mother, father sick at home. No smokers at home. No h/o asthma/COPD.  Got laid off.  Insurance runs out next week.  Wanted to be evaluated this week.  H/o PVCs was on bystolic - stopped 2/2 fatigue.  Denies palpitation sxs. Hypertriglyceridemia - not compliant with tricor - financial concerns.  Past Medical History  Diagnosis Date  . PVC's (premature ventricular contractions)   . Hypertriglyceridemia   . Obesity   . Hx of migraines   . Gout      Review of Systems Per HPI    Objective:   Physical Exam  Nursing note and vitals reviewed. Constitutional: He appears well-developed and well-nourished. No distress.  HENT:  Head: Normocephalic and atraumatic.  Right Ear: Hearing, tympanic membrane, external ear and ear canal normal.  Left Ear: Hearing, tympanic membrane, external ear and ear canal normal.  Nose: Mucosal edema (turbinate swelling and irritation evident) and rhinorrhea present. Right sinus exhibits no maxillary sinus tenderness and no frontal sinus tenderness. Left sinus exhibits frontal sinus tenderness (mild). Left sinus exhibits no maxillary sinus tenderness.  Mouth/Throat: Uvula is midline and mucous membranes are normal. Posterior oropharyngeal erythema present. No oropharyngeal exudate, posterior oropharyngeal edema or tonsillar abscesses.  Eyes: Conjunctivae normal and EOM are normal. Pupils are equal, round, and reactive to light. No scleral icterus.  Neck: Normal range of motion. Neck supple.  Cardiovascular: Normal rate, regular rhythm, normal heart sounds and intact distal pulses.   No murmur  heard. Pulmonary/Chest: Effort normal and breath sounds normal. No respiratory distress. He has no wheezes. He has no rales.  Lymphadenopathy:    He has no cervical adenopathy.  Skin: Skin is warm and dry. No rash noted.       Assessment & Plan:

## 2012-08-11 ENCOUNTER — Other Ambulatory Visit: Payer: BC Managed Care – PPO

## 2012-12-26 ENCOUNTER — Ambulatory Visit (INDEPENDENT_AMBULATORY_CARE_PROVIDER_SITE_OTHER): Payer: BC Managed Care – PPO | Admitting: Family Medicine

## 2012-12-26 VITALS — BP 118/66 | HR 55 | Temp 98.3°F | Resp 14 | Ht 68.0 in | Wt 213.0 lb

## 2012-12-26 DIAGNOSIS — L0291 Cutaneous abscess, unspecified: Secondary | ICD-10-CM

## 2012-12-26 DIAGNOSIS — L0390 Cellulitis, unspecified: Secondary | ICD-10-CM

## 2012-12-26 MED ORDER — DOXYCYCLINE HYCLATE 100 MG PO CAPS: 100.0000 mg | ORAL_CAPSULE | Freq: Two times a day (BID) | ORAL | Status: DC

## 2012-12-26 NOTE — Progress Notes (Signed)
37 yo with 3 days of left foot ulceration and red streaks.  Last dT:  2013  Patient also has a one-week history of what he thinks is poison ivy on his arms and buttocks. 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  Objective:  NAD 1 cm superficial ulcer dorsal left foot.  No edema.  Red streaks running up leg.  Multiple small 1-2 mm papules on forearms, sparing interdigital spaces, which are been excoriated. Examination of upper gluteal area reveals erythematous rough skin consistent with heat rash  Assessment:  Cellulitis of foot. Poison ivy and heat rash  Plan: Cellulitis - Plan: doxycycline (VIBRAMYCIN) 100 MG capsule Prednisone 20 ii qd x 5 with food Signed, Elvina Sidle, MD

## 2012-12-26 NOTE — Patient Instructions (Addendum)
Cellulitis Cellulitis is an infection of the skin and the tissue beneath it. The infected area is usually red and tender. Cellulitis occurs most often in the arms and lower legs.  CAUSES  Cellulitis is caused by bacteria that enter the skin through cracks or cuts in the skin. The most common types of bacteria that cause cellulitis are Staphylococcus and Streptococcus. SYMPTOMS   Redness and warmth.  Swelling.  Tenderness or pain.  Fever. DIAGNOSIS  Your caregiver can usually determine what is wrong based on a physical exam. Blood tests may also be done. TREATMENT  Treatment usually involves taking an antibiotic medicine. HOME CARE INSTRUCTIONS   Take your antibiotics as directed. Finish them even if you start to feel better.  Keep the infected arm or leg elevated to reduce swelling.  Apply a warm cloth to the affected area up to 4 times per day to relieve pain.  Only take over-the-counter or prescription medicines for pain, discomfort, or fever as directed by your caregiver.  Keep all follow-up appointments as directed by your caregiver. SEEK MEDICAL CARE IF:   You notice red streaks coming from the infected area.  Your red area gets larger or turns dark in color.  Your bone or joint underneath the infected area becomes painful after the skin has healed.  Your infection returns in the same area or another area.  You notice a swollen bump in the infected area.  You develop new symptoms. SEEK IMMEDIATE MEDICAL CARE IF:   You have a fever.  You feel very sleepy.  You develop vomiting or diarrhea.  You have a general ill feeling (malaise) with muscle aches and pains. MAKE SURE YOU:   Understand these instructions.  Will watch your condition.  Will get help right away if you are not doing well or get worse. Document Released: 04/03/2005 Document Revised: 12/24/2011 Document Reviewed: 09/09/2011 [employer] Patient Information 2014 [employer].  

## 2013-06-25 ENCOUNTER — Encounter: Payer: Self-pay | Admitting: Cardiovascular Disease

## 2013-06-25 ENCOUNTER — Ambulatory Visit (INDEPENDENT_AMBULATORY_CARE_PROVIDER_SITE_OTHER): Payer: BC Managed Care – PPO | Admitting: Cardiovascular Disease

## 2013-06-25 VITALS — BP 126/82 | HR 82 | Ht 68.0 in | Wt 221.0 lb

## 2013-06-25 DIAGNOSIS — R079 Chest pain, unspecified: Secondary | ICD-10-CM | POA: Insufficient documentation

## 2013-06-25 DIAGNOSIS — E781 Pure hyperglyceridemia: Secondary | ICD-10-CM

## 2013-06-25 DIAGNOSIS — R002 Palpitations: Secondary | ICD-10-CM

## 2013-06-25 DIAGNOSIS — K219 Gastro-esophageal reflux disease without esophagitis: Secondary | ICD-10-CM

## 2013-06-25 NOTE — Assessment & Plan Note (Signed)
Atypical chest pain. Low risk for underlying coronary artery disease. We have offered stress echo. He did one of these several years ago. He does not think it is cardiac. We suggested he could try NSAIDs as well as proton pump inhibitors. Call our office if symptoms do not improve. 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

## 2013-06-25 NOTE — Progress Notes (Signed)
Patient ID: Dylan Oliver, male    DOB: 19-Aug-1975, 37 y.o.   MRN: [id number]  HPI Comments: Mr. Weida is a 37 year old gentleman with no known cardiac history, mild obesity, prior symptoms of tachycardia, chest pain seen in January 2013.  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 No significant symptoms of chest pain with exertion such as processing and animal or at work. He does report having point specific chest pain on the left side of his mediastinum over the past 2 weeks. Seems to come and go, 4-5/10 in intensity. This can come on at rest, sometimes with exertion. Does not seem to have any rhyme or reason. Pain feels deep.  Also had episodes of nausea recently, 3 episodes in the past several days. Denies any gastroenteritis symptoms, not constipated, no GERD-type symptoms  Triglycerides very elevated in the past. He did not want to take a fenofibrate Cholesterol 180s No longer take diastolic but does have occasional palpitations and skipping.   EKG today shows normal sinus rhythm with rate 82 beats per minute with no significant ST or T wave changes   Outpatient Encounter Prescriptions as of 06/25/2013  Medication Sig  . colchicine 0.6 MG tablet Take 2 initially, then may take one pill twice daily as needed for gout attack.  Marland Kitchen ibuprofen (ADVIL,MOTRIN) 600 MG tablet Take by mouth every 6 (six) hours as needed.  . nebivolol (BYSTOLIC) 10 MG tablet Take 5 mg by mouth daily.   . [DISCONTINUED] doxycycline (VIBRAMYCIN) 100 MG capsule Take 1 capsule (100 mg total) by mouth 2 (two) times daily.  . [DISCONTINUED] fenofibrate (TRICOR) 145 MG tablet Take 1 tablet (145 mg total) by mouth daily.  . [DISCONTINUED] guaiFENesin-codeine (ROBITUSSIN AC) 100-10 MG/5ML syrup Take 5 mLs by mouth 2 (two) times daily as needed for cough.     Review of Systems  Constitutional: Negative.    HENT: Negative.   Eyes: Negative.   Cardiovascular: Positive for chest pain and palpitations.  Gastrointestinal: Positive for nausea.  Musculoskeletal: Negative.   Skin: Negative.   Neurological: Positive for dizziness.  Psychiatric/Behavioral: Negative.   All other systems reviewed and are negative.   BP 126/82  Pulse 82  Ht 5\' 8"  (1.727 m)  Wt 221 lb (100.245 kg)  BMI 33.61 kg/m2  Physical Exam  Nursing note and vitals reviewed. Constitutional: He is oriented to person, place, and time. He appears well-developed and well-nourished.  HENT:  Head: Normocephalic.  Nose: Nose normal.  Mouth/Throat: Oropharynx is clear and moist.  Eyes: Conjunctivae are normal. Pupils are equal, round, and reactive to light.  Neck: Normal range of motion. Neck supple. No JVD present.  Cardiovascular: Normal rate, regular rhythm, S1 normal, S2 normal, normal heart sounds and intact distal pulses.  Exam reveals no gallop and no friction rub.   No murmur heard. Pulmonary/Chest: Effort normal and breath sounds normal. No respiratory distress. He has no wheezes. He has no rales. He exhibits no tenderness.  Abdominal: Soft. Bowel sounds are normal. He exhibits no distension. There is no tenderness.  Musculoskeletal: Normal range of motion. He exhibits no edema and no tenderness.  Lymphadenopathy:    He has no cervical adenopathy.  Neurological: He is alert and oriented to person, place, and time. Coordination normal.  Skin: Skin is warm and dry. No rash noted. No erythema.  Psychiatric: He has a normal mood and affect.  His behavior is normal. Judgment and thought content normal.      Assessment and Plan

## 2013-06-25 NOTE — Assessment & Plan Note (Signed)
He prefers to leave his elevated triglycerides untreated at this time. Suggested he closely watch his diet. He does not like medications in general

## 2013-06-25 NOTE — Patient Instructions (Signed)
Cause of your chest pain is still uncertain  Please try naproxen 1 to 2 up to twice a day for inflammation/pain Would take with omeprazole 20 mg twice a day to protect your stomach  Please call us if you have new issues that need to be addressed before your next appt.

## 2013-06-25 NOTE — Assessment & Plan Note (Signed)
If symptoms of nausea persist, Suggested he start on omeprazole 20 mg twice a day. He denies having any GERD symptoms.

## 2013-06-25 NOTE — Assessment & Plan Note (Signed)
He does not want beta blockers at this time. Could give him propranolol as needed if symptoms get worse. Likely from stress. He is likely having ectopy, possibly APCs though unable to exclude PVCs

## 2013-08-12 ENCOUNTER — Ambulatory Visit (INDEPENDENT_AMBULATORY_CARE_PROVIDER_SITE_OTHER): Payer: BC Managed Care – PPO | Admitting: Family Medicine

## 2013-08-12 VITALS — BP 108/78 | HR 99 | Temp 98.0°F | Resp 16 | Ht 68.0 in | Wt 228.4 lb

## 2013-08-12 DIAGNOSIS — J329 Chronic sinusitis, unspecified: Secondary | ICD-10-CM

## 2013-08-12 DIAGNOSIS — R059 Cough, unspecified: Secondary | ICD-10-CM

## 2013-08-12 DIAGNOSIS — J029 Acute pharyngitis, unspecified: Secondary | ICD-10-CM

## 2013-08-12 DIAGNOSIS — R05 Cough: Secondary | ICD-10-CM

## 2013-08-12 DIAGNOSIS — R0982 Postnasal drip: Secondary | ICD-10-CM

## 2013-08-12 DIAGNOSIS — R52 Pain, unspecified: Secondary | ICD-10-CM

## 2013-08-12 LAB — POCT INFLUENZA A/B
Influenza A, POC: NEGATIVE
Influenza B, POC: NEGATIVE

## 2013-08-12 LAB — POCT RAPID STREP A (OFFICE): Rapid Strep A Screen: NEGATIVE

## 2013-08-12 MED ORDER — FLUTICASONE PROPIONATE 50 MCG/ACT NA SUSP: 2.0000 | Freq: Every day | NASAL | Status: DC

## 2013-08-12 MED ORDER — HYDROCODONE-HOMATROPINE 5-1.5 MG/5ML PO SYRP: 5.0000 mL | ORAL_SOLUTION | Freq: Every evening | ORAL | Status: DC | PRN

## 2013-08-12 NOTE — Progress Notes (Signed)
Chief Complaint:  Chief Complaint  Patient presents with  . Sinus Congestion    X 3 days  . Sore Throat    X today    HPI: Dylan Oliver is a 38 y.o. male who is here for  2 day history of head cold and sinus problems, clear drainage, + chills x 1, sore throat started today, and has some soreness in his neck, no rashesno HA, no facial pain, muscle aches/tiredness.  Does not get flu vaccine, did it once and got him sick. Has an 37 y/o who is fine  and wife has bronchitis  Past Medical History  Diagnosis Date  . PVC's (premature ventricular contractions)   . Hypertriglyceridemia   . Obesity   . Hx of migraines   . Gout    Past Surgical History  Procedure Laterality Date  . Appendectomy  1993  . Back surgery      what sounds like pilonidal cyst extraction  . Cardiovascular stress test  2003    poor exercise tolerance, no ischemia   History   Social History  . Marital Status: Married    Spouse Name: N/A    Number of Children: N/A  . Years of Education: N/A   Occupational History  . Museum/gallery conservator    Social History Main Topics  . Smoking status: Current Some Day Smoker -- 4 years    Types: Cigarettes    Last Attempt to Quit: 07/16/1997  . Smokeless tobacco: Current User    Types: Snuff  . Alcohol Use: Yes     Comment: ocassionally  . Drug Use: No  . Sexual Activity: None   Other Topics Concern  . 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 Hypertension Father   . Hypertension Mother   . Diabetes Father   . Coronary artery disease Maternal Grandfather 51  . Stroke Neg Hx   . Cancer Mother     skin   No Known Allergies Prior to Admission medications   Medication Sig Start Date End Date Taking? Authorizing Provider  colchicine 0.6 MG tablet Take 2 initially, then may take one pill twice daily  as needed for gout attack. 05/11/12   Ria Bush, MD  ibuprofen (ADVIL,MOTRIN) 600 MG tablet Take by mouth every 6 (six) hours as needed.    Historical Provider, MD  nebivolol (BYSTOLIC) 10 MG tablet Take 5 mg by mouth daily.  04/24/12   Minna Merritts, MD     ROS: The patient denies night sweats, unintentional weight loss, chest pain, palpitations, wheezing, dyspnea on exertion, nausea, vomiting, abdominal pain, dysuria, hematuria, melena, numbness, weakness, or tingling.  All other systems have been reviewed and were otherwise negative with the exception of those mentioned in the HPI and as above.    PHYSICAL EXAM: Filed Vitals:   08/12/13 1811  BP: 108/78  Pulse: 99  Temp: 98 F (36.7 C)  Resp: 16   Filed Vitals:   08/12/13 1811  Height: 5\' 8"  (1.727 m)  Weight: 228 lb 6.4 oz (103.602 kg)   Body mass index is 34.74 kg/(m^2).  General: Alert, no acute distress HEENT:  Normocephalic, atraumatic, oropharynx patent. EOMI, PERRLA TM nl. + sinus tenderness, + vesicles.  Cardiovascular:  Regular rate and rhythm, no rubs murmurs  or gallops.  No Carotid bruits, radial pulse intact. No pedal edema.  Respiratory: Clear to auscultation bilaterally.  No wheezes, rales, or rhonchi.  No cyanosis, no use of accessory musculature GI: No organomegaly, abdomen is soft and non-tender, positive bowel sounds.  No masses. Skin: No rashes. Neurologic: Facial musculature symmetric. Psychiatric: Patient is appropriate throughout our interaction. Lymphatic: No cervical lymphadenopathy Musculoskeletal: Gait intact.   LABS: Results for orders placed in visit on 08/12/13  POCT INFLUENZA A/B      Result Value Range   Influenza A, POC Negative     Influenza B, POC Negative    POCT RAPID STREP A (OFFICE)      Result Value Range   Rapid Strep A Screen Negative  Negative     EKG/XRAY:   Primary read interpreted by Dr. Marin Comment at [facility].   ASSESSMENT/PLAN: Encounter Diagnoses  Name Primary?    . Sore throat   . Body aches   . Sinusitis Yes  . Post-nasal drip   . Cough    Rx Flonase and hycodan sxs treatment Viral sinusitis vs URI sxs Will call if worse sxs   Gross sideeffects, risk and benefits, and alternatives of medications d/w patient. Patient is aware that all medications have potential sideeffects and we are unable to predict every sideeffect or drug-drug interaction that may occur.  LE, Banner, DO 08/12/2013 8:52 PM

## 2013-08-12 NOTE — Patient Instructions (Signed)

## 2014-04-07 ENCOUNTER — Ambulatory Visit (INDEPENDENT_AMBULATORY_CARE_PROVIDER_SITE_OTHER): Payer: BC Managed Care – PPO | Admitting: Family Medicine

## 2014-04-07 ENCOUNTER — Encounter: Payer: Self-pay | Admitting: Family Medicine

## 2014-04-07 VITALS — BP 124/82 | HR 71 | Temp 98.3°F | Wt 225.8 lb

## 2014-04-07 DIAGNOSIS — R591 Generalized enlarged lymph nodes: Secondary | ICD-10-CM | POA: Insufficient documentation

## 2014-04-07 DIAGNOSIS — L309 Dermatitis, unspecified: Secondary | ICD-10-CM | POA: Insufficient documentation

## 2014-04-07 DIAGNOSIS — E781 Pure hyperglyceridemia: Secondary | ICD-10-CM

## 2014-04-07 DIAGNOSIS — R030 Elevated blood-pressure reading, without diagnosis of hypertension: Secondary | ICD-10-CM

## 2014-04-07 DIAGNOSIS — R599 Enlarged lymph nodes, unspecified: Secondary | ICD-10-CM

## 2014-04-07 DIAGNOSIS — E785 Hyperlipidemia, unspecified: Secondary | ICD-10-CM

## 2014-04-07 DIAGNOSIS — IMO0001 Reserved for inherently not codable concepts without codable children: Secondary | ICD-10-CM | POA: Insufficient documentation

## 2014-04-07 DIAGNOSIS — R21 Rash and other nonspecific skin eruption: Secondary | ICD-10-CM | POA: Insufficient documentation

## 2014-04-07 MED ORDER — CLOTRIMAZOLE 1 % EX CREA: 1.0000 "application " | TOPICAL_CREAM | Freq: Two times a day (BID) | CUTANEOUS | Status: DC

## 2014-04-07 NOTE — Assessment & Plan Note (Signed)
Will return fasting for labwork. H/o marked hypertriglyceridemia.

## 2014-04-07 NOTE — Progress Notes (Signed)
BP 124/82  Pulse 71  Temp(Src) 98.3 F (36.8 C) (Oral)  Wt 225 lb 12 oz (102.4 kg)  SpO2 95%   CC: mult issues  Subjective:    Patient ID: Dylan Oliver, male    DOB: 1976/01/20, 38 y.o.   MRN: [id number]  HPI: Dylan Oliver is a 38 y.o. male presenting on 04/07/2014 for Knot on back of head, Blood Pressure Check, check Right foot and Headache   Last seen 07/2012.  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 Had stressful day Friday. Did have 2 migraines recently. Does not have regular stress relieving strategies. No vision changes, CP/tightness, SOB, leg swelling.  R foot - scratched foot last night. Leather allergy. Rash comes and goes. Feet at work stay moist. Rash only on feet.  Knot on back of head - present for last 3 months. Sometimes tender. May be enlarging. No inciting trauma/injury. May have started after bug bite L shoulder.   Wt Readings from Last 3 Encounters:  04/07/14 225 lb 12 oz (102.4 kg)  08/12/13 228 lb 6.4 oz (103.602 kg)  06/25/13 221 lb (100.245 kg)   Body mass index is 34.33 kg/(m^2). No regular exercise.  Relevant past medical, surgical, family and social history reviewed and updated as indicated.  Allergies and medications reviewed and updated. Current Outpatient Prescriptions on File Prior to Visit  Medication Sig  . ibuprofen (ADVIL,MOTRIN) 600 MG tablet Take by mouth every 6 (six) hours as needed.  . colchicine 0.6 MG tablet Take 2 initially, then may take one pill twice daily as needed for gout attack.   No current facility-administered medications on file prior to visit.    Review of Systems Per HPI unless specifically indicated above    Objective:    BP 124/82  Pulse 71  Temp(Src) 98.3 F (36.8 C) (Oral)  Wt 225 lb 12 oz (102.4 kg)  SpO2 95%  Physical Exam  Nursing note and vitals reviewed. Constitutional: He appears well-developed and well-nourished. No distress.  HENT:  Head: Normocephalic and  atraumatic.  Mouth/Throat: Oropharynx is clear and moist. No oropharyngeal exudate.  Eyes: Conjunctivae and EOM are normal. Pupils are equal, round, and reactive to light. No scleral icterus.  Musculoskeletal: He exhibits no edema.  Lymphadenopathy:       Head (right side): No submental, no submandibular, no tonsillar, no preauricular, no posterior auricular and no occipital adenopathy present.       Head (left side): Occipital (fullness) adenopathy present. No submental, no submandibular, no tonsillar, no preauricular and no posterior auricular adenopathy present.    He has no cervical adenopathy.       Right cervical: No superficial cervical adenopathy present.      Left cervical: No superficial cervical adenopathy present.       Right: No supraclavicular adenopathy present.       Left: No supraclavicular adenopathy present.  Skin: Skin is warm and dry. Rash noted.  Erosion/abrasion R medial midfoot oozing but no surrounding erythema.  Dressed with abx ointment and bandaid. Scaly rash interdigital areas as well as patches on foot       Assessment & Plan:   Problem List Items Addressed This Visit   Skin rash     Presumed tinea pedis leading to pruritis and now erosion from excoriation. Treat with abx ointment and bandage daily. For rash - start clotrimazole cream. Update if not improved with this.    Lymphadenopathy of head and  neck - Primary     Mild fullness at L occipital LN but LN not larger than 1cm in size. Monitor for now. Check CBC next blood work. Red flags to return discussed.    Relevant Orders      CBC with Differential   Hypertriglyceridemia     Will return fasting for labwork. H/o marked hypertriglyceridemia.    Relevant Orders      Lipid panel      Comprehensive metabolic panel      TSH   Elevated BP     Stress related. Discussed importance of healthy stress relieving strategies. Discussed options. Update if BP or headaches persist despite this.     Dyslipidemia       Follow up plan: Return if symptoms worsen or fail to improve.

## 2014-04-07 NOTE — Assessment & Plan Note (Signed)
Mild fullness at L occipital LN but LN not larger than 1cm in size. Monitor for now. Check CBC next blood work. Red flags to return discussed.

## 2014-04-07 NOTE — Assessment & Plan Note (Signed)
Stress related. Discussed importance of healthy stress relieving strategies. Discussed options. Update if BP or headaches persist despite this.

## 2014-04-07 NOTE — Patient Instructions (Signed)
Return tomorrow fasting for blood work Work on stress relieving strategies to help control blood pressure and headaches Let's keep an eye on left lymph node - if enlarging let me know. For foot - dress wound daily with antibiotic ointment and bandage. Should heal well. Then start using OTC clotrimazole between toes and on rash - I think foot rash is due to athlete's foot or foot fungus from work.  Athlete's Foot Athlete's foot (tinea pedis) is a fungal infection of the skin on the feet. It often occurs on the skin between the toes or underneath the toes. It can also occur on the soles of the feet. Athlete's foot is more likely to occur in hot, humid weather. Not washing your feet or changing your socks often enough can contribute to athlete's foot. The infection can spread from person to person (contagious). CAUSES Athlete's foot is caused by a fungus. This fungus thrives in warm, moist places. Most people get athlete's foot by sharing shower stalls, towels, and wet floors with an infected person. People with weakened immune systems, including those with diabetes, may be more likely to get athlete's foot. SYMPTOMS   Itchy areas between the toes or on the soles of the feet.  White, flaky, or scaly areas between the toes or on the soles of the feet.  Tiny, intensely itchy blisters between the toes or on the soles of the feet.  Tiny cuts on the skin. These cuts can develop a bacterial infection.  Thick or discolored toenails. DIAGNOSIS  Your caregiver can usually tell what the problem is by doing a physical exam. Your caregiver may also take a skin sample from the rash area. The skin sample may be examined under a microscope, or it may be tested to see if fungus will grow in the sample. A sample may also be taken from your toenail for testing. TREATMENT  Over-the-counter and prescription medicines can be used to kill the fungus. These medicines are available as powders or creams. Your caregiver  can suggest medicines for you. Fungal infections respond slowly to treatment. You may need to continue using your medicine for several weeks. PREVENTION   Do not share towels.  Wear sandals in wet areas, such as shared locker rooms and shared showers.  Keep your feet dry. Wear shoes that allow air to circulate. Wear cotton or wool socks. HOME CARE INSTRUCTIONS   Take medicines as directed by your caregiver. Do not use steroid creams on athlete's foot.  Keep your feet clean and cool. Wash your feet daily and dry them thoroughly, especially between your toes.  Change your socks every day. Wear cotton or wool socks. In hot climates, you may need to change your socks 2 to 3 times per day.  Wear sandals or canvas tennis shoes with good air circulation.  If you have blisters, soak your feet in Burow's solution or Epsom salts for 20 to 30 minutes, 2 times a day to dry out the blisters. Make sure you dry your feet thoroughly afterward. SEEK MEDICAL CARE IF:   You have a fever.  You have swelling, soreness, warmth, or redness in your foot.  You are not getting better after 7 days of treatment.  You are not completely cured after 30 days.  You have any problems caused by your medicines. MAKE SURE YOU:   Understand these instructions.  Will watch your condition.  Will get help right away if you are not doing well or get worse. Document Released: 06/21/2000 Document  Revised: 09/16/2011 Document Reviewed: 04/12/2011 [employer] Patient Information 2015 [employer]. This information is not intended to replace advice given to you by your health care provider. Make sure you discuss any questions you have with your health care provider.  

## 2014-04-07 NOTE — Assessment & Plan Note (Signed)
Presumed tinea pedis leading to pruritis and now erosion from excoriation. Treat with abx ointment and bandage daily. For rash - start clotrimazole cream. Update if not improved with this.

## 2014-04-07 NOTE — Progress Notes (Signed)
Pre visit review using our clinic review tool, if applicable. No additional management support is needed unless otherwise documented below in the visit note. 

## 2014-04-08 ENCOUNTER — Telehealth: Payer: Self-pay | Admitting: Family Medicine

## 2014-04-08 ENCOUNTER — Other Ambulatory Visit (INDEPENDENT_AMBULATORY_CARE_PROVIDER_SITE_OTHER): Payer: BC Managed Care – PPO

## 2014-04-08 ENCOUNTER — Ambulatory Visit: Payer: BC Managed Care – PPO | Admitting: Family Medicine

## 2014-04-08 DIAGNOSIS — R591 Generalized enlarged lymph nodes: Secondary | ICD-10-CM

## 2014-04-08 DIAGNOSIS — R599 Enlarged lymph nodes, unspecified: Secondary | ICD-10-CM

## 2014-04-08 DIAGNOSIS — E781 Pure hyperglyceridemia: Secondary | ICD-10-CM

## 2014-04-08 LAB — COMPREHENSIVE METABOLIC PANEL
ALBUMIN: 4.1 g/dL (ref 3.5–5.2)
ALT: 55 U/L — ABNORMAL HIGH (ref 0–53)
AST: 29 U/L (ref 0–37)
Alkaline Phosphatase: 45 U/L (ref 39–117)
BUN: 9 mg/dL (ref 6–23)
CO2: 27 meq/L (ref 19–32)
Calcium: 8.6 mg/dL (ref 8.4–10.5)
Chloride: 106 mEq/L (ref 96–112)
Creatinine, Ser: 0.9 mg/dL (ref 0.4–1.5)
GFR: 104.86 mL/min (ref >60.00)
GLUCOSE: 90 mg/dL (ref 70–99)
POTASSIUM: 4.2 meq/L (ref 3.5–5.1)
SODIUM: 139 meq/L (ref 135–145)
TOTAL PROTEIN: 7.1 g/dL (ref 6.0–8.3)
Total Bilirubin: 0.7 mg/dL (ref 0.2–1.2)

## 2014-04-08 LAB — CBC WITH DIFFERENTIAL/PLATELET
BASOS ABS: 0 10*3/uL (ref 0.0–0.1)
Basophils Relative: 0.2 % (ref 0.0–3.0)
EOS ABS: 0.1 10*3/uL (ref 0.0–0.7)
Eosinophils Relative: 1.3 % (ref 0.0–5.0)
HEMATOCRIT: 46.4 % (ref 39.0–52.0)
Hemoglobin: 15.7 g/dL (ref 13.0–17.0)
LYMPHS ABS: 2.1 10*3/uL (ref 0.7–4.0)
LYMPHS PCT: 20 % (ref 12.0–46.0)
MCHC: 33.8 g/dL (ref 30.0–36.0)
MCV: 90.3 fl (ref 78.0–100.0)
MONOS PCT: 8.1 % (ref 3.0–12.0)
Monocytes Absolute: 0.9 10*3/uL (ref 0.1–1.0)
Neutro Abs: 7.5 10*3/uL (ref 1.4–7.7)
Neutrophils Relative %: 70.4 % (ref 43.0–77.0)
PLATELETS: 256 10*3/uL (ref 150.0–400.0)
RBC: 5.13 Mil/uL (ref 4.22–5.81)
RDW: 12.9 % (ref 11.5–15.5)
WBC: 10.7 10*3/uL — ABNORMAL HIGH (ref 4.0–10.5)

## 2014-04-08 LAB — LIPID PANEL
CHOLESTEROL: 169 mg/dL (ref 0–200)
HDL: 26.5 mg/dL — ABNORMAL LOW (ref >39.00)
LDL Cholesterol: 107 mg/dL — ABNORMAL HIGH (ref 0–99)
NonHDL: 142.5
Total CHOL/HDL Ratio: 6
Triglycerides: 176 mg/dL — ABNORMAL HIGH (ref 0.0–149.0)
VLDL: 35.2 mg/dL (ref 0.0–40.0)

## 2014-04-08 LAB — TSH: TSH: 0.59 u[IU]/mL (ref 0.35–4.50)

## 2014-04-08 NOTE — Telephone Encounter (Signed)
emmi mailed  °

## 2014-04-11 ENCOUNTER — Encounter: Payer: Self-pay | Admitting: *Deleted

## 2014-05-12 ENCOUNTER — Ambulatory Visit (INDEPENDENT_AMBULATORY_CARE_PROVIDER_SITE_OTHER): Payer: BC Managed Care – PPO | Admitting: Family Medicine

## 2014-05-12 ENCOUNTER — Encounter: Payer: Self-pay | Admitting: Family Medicine

## 2014-05-12 VITALS — BP 124/82 | HR 77 | Temp 98.0°F | Wt 229.0 lb

## 2014-05-12 DIAGNOSIS — R591 Generalized enlarged lymph nodes: Secondary | ICD-10-CM

## 2014-05-12 DIAGNOSIS — R599 Enlarged lymph nodes, unspecified: Secondary | ICD-10-CM

## 2014-05-12 NOTE — Assessment & Plan Note (Signed)
Palpable bilat post auricular LAD without red flags. Anticipate pt just has prominent postauricular lymph nodes. Reassured. Update if enlarging or new sxs develop.

## 2014-05-12 NOTE — Patient Instructions (Signed)
I think these spots are fine. Let me know if enlarging or worse pain.

## 2014-05-12 NOTE — Progress Notes (Signed)
BP 124/82 mmHg  Pulse 77  Temp(Src) 98 F (36.7 C) (Oral)  Wt 229 lb (103.874 kg)  SpO2 97%   CC: knot on back of head  Subjective:    Patient ID: Dylan Oliver, male    DOB: [birth year], 38 y.o.   MRN: [id number]  HPI: Dylan Oliver is a 38 y.o. male presenting on 05/12/2014 for Acute Visit   See prior note for details - seen here 04/07/2014 with dx occipital LAD <1cm in size. CBC WNL except WBC 10.7. He endorses increased fatigue ongoing for last several months. Denies fevers, weight loss, congestion, cough or skin infections. Intermittently enlarges then decreases in size. Has been skinning and cutting up deer.  From last visit: Knot on back of head - present for last 3 months. Sometimes tender. May be enlarging. No inciting trauma/injury. May have started after bug bite L shoulder.   Relevant past medical, surgical, family and social history reviewed and updated as indicated.  Allergies and medications reviewed and updated. Current Outpatient Prescriptions on File Prior to Visit  Medication Sig  . clotrimazole (LOTRIMIN) 1 % cream Apply 1 application topically 2 (two) times daily.  . colchicine 0.6 MG tablet Take 2 initially, then may take one pill twice daily as needed for gout attack.  Marland Kitchen ibuprofen (ADVIL,MOTRIN) 600 MG tablet Take by mouth every 6 (six) hours as needed.   No current facility-administered medications on file prior to visit.    Review of Systems Per HPI unless specifically indicated above    Objective:    BP 124/82 mmHg  Pulse 77  Temp(Src) 98 F (36.7 C) (Oral)  Wt 229 lb (103.874 kg)  SpO2 97%  Physical Exam  Constitutional: He appears well-developed and well-nourished. No distress.  HENT:  Head: Normocephalic and atraumatic.  Right Ear: Hearing, tympanic membrane, external ear and ear canal normal.  Left Ear: Hearing, tympanic membrane, external ear and ear canal normal.  Nose: Right sinus exhibits no maxillary sinus tenderness and no frontal  sinus tenderness. Left sinus exhibits no maxillary sinus tenderness and no frontal sinus tenderness.  Mouth/Throat: Uvula is midline, oropharynx is clear and moist and mucous membranes are normal. No oropharyngeal exudate, posterior oropharyngeal edema, posterior oropharyngeal erythema or tonsillar abscesses.  Eyes: Conjunctivae and EOM are normal. Pupils are equal, round, and reactive to light. No scleral icterus.  Neck: Normal range of motion. Neck supple. No thyromegaly present.  Lymphadenopathy:       Head (right side): Posterior auricular adenopathy present. No submental, no submandibular, no tonsillar, no preauricular and no occipital adenopathy present.       Head (left side): Posterior auricular adenopathy present. No submental, no submandibular, no tonsillar, no preauricular and no occipital adenopathy present.    He has no cervical adenopathy.    He has no axillary adenopathy.       Right axillary: No lateral adenopathy present.       Left axillary: No lateral adenopathy present.      Right: No inguinal and no supraclavicular adenopathy present.       Left: No inguinal and no supraclavicular adenopathy present.  Small palpable LN bilateral post auricular region <1cm  Skin: Skin is warm and dry. No rash noted.  Nursing note and vitals reviewed.      Lab on 04/08/2014  Component Date Value Ref Range Status  . Cholesterol 04/08/2014 169  0 - 200 mg/dL Final   ATP III Classification  Desirable:  < 200 mg/dL               Borderline High:  200 - 239 mg/dL          High:  > = 240 mg/dL  . Triglycerides 04/08/2014 176.0* 0.0 - 149.0 mg/dL Final   Normal:  <150 mg/dLBorderline High:  150 - 199 mg/dL  . HDL 04/08/2014 26.50* >39.00 mg/dL Final  . VLDL 04/08/2014 35.2  0.0 - 40.0 mg/dL Final  . LDL Cholesterol 04/08/2014 107* 0 - 99 mg/dL Final  . Total CHOL/HDL Ratio 04/08/2014 6   Final                  Men          Women1/2 Average Risk     3.4          3.3Average Risk           5.0          4.42X Average Risk          9.6          7.13X Average Risk          15.0          11.0                      . NonHDL 04/08/2014 142.50   Final   NOTE:  Non-HDL goal should be 30 mg/dL higher than patient's LDL goal (i.e. LDL goal of < 70 mg/dL, would have non-HDL goal of < 100 mg/dL)  . Sodium 04/08/2014 139  135 - 145 mEq/L Final  . Potassium 04/08/2014 4.2  3.5 - 5.1 mEq/L Final  . Chloride 04/08/2014 106  96 - 112 mEq/L Final  . CO2 04/08/2014 27  19 - 32 mEq/L Final  . Glucose, Bld 04/08/2014 90  70 - 99 mg/dL Final  . BUN 04/08/2014 9  6 - 23 mg/dL Final  . Creatinine, Ser 04/08/2014 0.9  0.4 - 1.5 mg/dL Final  . Total Bilirubin 04/08/2014 0.7  0.2 - 1.2 mg/dL Final  . Alkaline Phosphatase 04/08/2014 45  39 - 117 U/L Final  . AST 04/08/2014 29  0 - 37 U/L Final  . ALT 04/08/2014 55* 0 - 53 U/L Final  . Total Protein 04/08/2014 7.1  6.0 - 8.3 g/dL Final  . Albumin 04/08/2014 4.1  3.5 - 5.2 g/dL Final  . Calcium 04/08/2014 8.6  8.4 - 10.5 mg/dL Final  . GFR 04/08/2014 104.86  >60.00 mL/min Final  . TSH 04/08/2014 0.59  0.35 - 4.50 uIU/mL Final  . WBC 04/08/2014 10.7* 4.0 - 10.5 K/uL Final  . RBC 04/08/2014 5.13  4.22 - 5.81 Mil/uL Final  . Hemoglobin 04/08/2014 15.7  13.0 - 17.0 g/dL Final  . HCT 04/08/2014 46.4  39.0 - 52.0 % Final  . MCV 04/08/2014 90.3  78.0 - 100.0 fl Final  . MCHC 04/08/2014 33.8  30.0 - 36.0 g/dL Final  . RDW 04/08/2014 12.9  11.5 - 15.5 % Final  . Platelets 04/08/2014 256.0  150.0 - 400.0 K/uL Final  . Neutrophils Relative % 04/08/2014 70.4  43.0 - 77.0 % Final  . Lymphocytes Relative 04/08/2014 20.0  12.0 - 46.0 % Final  . Monocytes Relative 04/08/2014 8.1  3.0 - 12.0 % Final  . Eosinophils Relative 04/08/2014 1.3  0.0 - 5.0 % Final  . Basophils Relative 04/08/2014 0.2  0.0 - 3.0 % Final  .  Neutro Abs 04/08/2014 7.5  1.4 - 7.7 K/uL Final  . Lymphs Abs 04/08/2014 2.1  0.7 - 4.0 K/uL Final  . Monocytes Absolute 04/08/2014 0.9  0.1 - 1.0 K/uL  Final  . Eosinophils Absolute 04/08/2014 0.1  0.0 - 0.7 K/uL Final  . Basophils Absolute 04/08/2014 0.0  0.0 - 0.1 K/uL Final    Assessment & Plan:   Problem List Items Addressed This Visit    Lymphadenopathy of head and neck - Primary    Palpable bilat post auricular LAD without red flags. Anticipate pt just has prominent postauricular lymph nodes. Reassured. Update if enlarging or new sxs develop.        Follow up plan: Return if symptoms worsen or fail to improve.

## 2019-12-16 ENCOUNTER — Ambulatory Visit: Payer: BC Managed Care – PPO | Admitting: Dermatology

## 2019-12-16 ENCOUNTER — Other Ambulatory Visit: Payer: Self-pay

## 2019-12-16 DIAGNOSIS — B354 Tinea corporis: Secondary | ICD-10-CM

## 2019-12-16 DIAGNOSIS — L280 Lichen simplex chronicus: Secondary | ICD-10-CM | POA: Diagnosis not present

## 2019-12-16 MED ORDER — TRIAMCINOLONE ACETONIDE 0.1 % EX CREA: 1.0000 "application " | TOPICAL_CREAM | CUTANEOUS | 1 refills | Status: DC

## 2019-12-16 MED ORDER — FLUCONAZOLE 200 MG PO TABS
200.0000 mg | ORAL_TABLET | ORAL | 0 refills | Status: DC
Start: 2019-12-17 — End: 2020-03-21

## 2019-12-16 NOTE — Progress Notes (Signed)
   Follow-Up Visit   Subjective  Dylan Oliver is a 44 y.o. male who presents for the following: Tinea (51m f/u, buttocks, legs, R arm, itchy, 91m f/u, pt says never cleares completely, finished Lamisil 250mg  52months, ran out of Ketoconazole cr).   The following portions of the chart were reviewed this encounter and updated as appropriate:  Tobacco  Allergies  Meds  Problems  Med Hx  Surg Hx  Fam Hx      Review of Systems:  No other skin or systemic complaints except as noted in HPI or Assessment and Plan.  Objective  Well appearing patient in no apparent distress; mood and affect are within normal limits.  A focused examination was performed including buttocks, back, feet, trunk. Relevant physical exam findings are noted in the Assessment and Plan.  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  I, Othelia Pulling, RMA, am acting as scribe for Sarina Ser, MD .Documentation: I have reviewed the above documentation for accuracy and completeness, and I agree with the above.  Sarina Ser, MD

## 2019-12-16 NOTE — Patient Instructions (Addendum)
Triamcinolone cream 0.1%  Apply to itchy spots on buttocks, feet, and trunk 1 to 2 times a day up to 5 days a week until clear, avoid using on face, under arms and in groin.

## 2019-12-20 ENCOUNTER — Encounter: Payer: Self-pay | Admitting: Dermatology

## 2020-01-27 ENCOUNTER — Other Ambulatory Visit: Payer: Self-pay | Admitting: Dermatology

## 2020-01-27 DIAGNOSIS — L280 Lichen simplex chronicus: Secondary | ICD-10-CM

## 2020-03-21 ENCOUNTER — Ambulatory Visit: Payer: BC Managed Care – PPO | Admitting: Dermatology

## 2020-03-21 ENCOUNTER — Encounter: Payer: Self-pay | Admitting: Dermatology

## 2020-03-21 ENCOUNTER — Other Ambulatory Visit: Payer: Self-pay

## 2020-03-21 DIAGNOSIS — L280 Lichen simplex chronicus: Secondary | ICD-10-CM

## 2020-03-21 DIAGNOSIS — B353 Tinea pedis: Secondary | ICD-10-CM | POA: Diagnosis not present

## 2020-03-21 DIAGNOSIS — D485 Neoplasm of uncertain behavior of skin: Secondary | ICD-10-CM | POA: Diagnosis not present

## 2020-03-21 DIAGNOSIS — L2389 Allergic contact dermatitis due to other agents: Secondary | ICD-10-CM | POA: Diagnosis not present

## 2020-03-21 DIAGNOSIS — D492 Neoplasm of unspecified behavior of bone, soft tissue, and skin: Secondary | ICD-10-CM

## 2020-03-21 NOTE — Progress Notes (Signed)
Follow-Up Visit   Subjective  Dylan Oliver is a 44 y.o. 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  The following portions of the chart were reviewed this encounter and updated as appropriate:  Tobacco  Allergies  Meds  Problems  Med Hx  Surg Hx  Fam Hx     Review of Systems:  No other skin or systemic complaints except as noted in HPI or Assessment and Plan.  Objective  Well appearing patient in no apparent distress; mood and affect are within normal limits.  A focused examination was performed including neck, trunk, arms, legs, feet, buttocks. Relevant physical exam findings are noted in the Assessment and Plan.  Objective  foot, buttocks, L leg, R neck: Edemateous red thick plaque   Images                    Objective  Left Shoulder - Anterior: Pink scaly patches     Objective  Left Thigh - Anterior: Edemateous red thick plaque     Objective  bil feet/toenails: Feet and  nails clear today   Assessment & Plan  Lichen simplex chronicus foot, buttocks, L leg, R neck  With hx of bleeding from scratching  Cont TMC 0.1% cr qd prn itching Avoid scartching  Other Related Medications triamcinolone cream (KENALOG) 0.1 %  Neoplasm of skin - R/O Atopic Dermatitis/Eczema/ Atopic Neurodermatitis Left Shoulder - Anterior  Skin / nail biopsy Type of biopsy: punch   Informed consent: discussed and consent obtained   Timeout: patient name, date of birth, surgical site, and procedure verified   Procedure prep:  Patient was prepped and draped in usual  sterile fashion (The patient was cleaned and prepped) Prep type:  Isopropyl alcohol Anesthesia: the lesion was anesthetized in a standard fashion   Anesthetic:  1% lidocaine w/ epinephrine 1-100,000 buffered w/ 8.4% NaHCO3 Punch size:  3.5 mm Suture size:  4-0 Suture type: nylon   Suture removal (days):  7 Hemostasis achieved with: suture, pressure and aluminum chloride   Outcome: patient tolerated procedure well   Post-procedure details: sterile dressing applied and wound care instructions given   Dressing type: bandage, pressure dressing and bacitracin    Specimen 1 - Surgical pathology Differential Diagnosis: D48.5 R/O Atopic Neurodermatitis Check Margins: No Pink scaly patches  Left Thigh - Anterior - R/O Lichen Simplex Chronicus/Prurigo Nodularis  Skin / nail biopsy Type of biopsy: punch   Informed consent: discussed and consent obtained   Timeout: patient name, date of birth, surgical site, and procedure verified   Procedure prep:  Patient was prepped and draped in usual sterile fashion (The patient was cleaned and prepped) Prep type:  Isopropyl alcohol Anesthesia: the lesion was anesthetized in a standard fashion   Anesthetic:  1% lidocaine w/ epinephrine 1-100,000 buffered w/ 8.4% NaHCO3 Punch size:  3.5 mm Suture size:  4-0 Suture type: nylon   Suture removal (days):  7 Hemostasis achieved with: suture, pressure and aluminum chloride   Outcome: patient tolerated procedure well   Post-procedure details: sterile dressing applied and wound care instructions given   Dressing type: bandage, pressure  dressing and bacitracin    Specimen 2 - Surgical pathology Differential Diagnosis: V42.5 Lichen Simplex Chronicus Check Margins: No Edemateous red thick plaque  L shoulder R/O Atopic neurodermatitis  L anterior thigh R/O LSC  Tinea pedis of both feet bil feet/toenails KOH proven Tinea unguium Feet/nails appear clear today after Lamisil 3 month treatment  (05/2019-07/2019) and Diflucan 3d/wk x 1 month treatment (12/2019)  1. Lichen simplex chronicus foot, buttocks, L leg, R neck With hx of bleeding from scratching  Cont TMC 0.1% cr qd prn itching Avoid scartching  Other Related Medications triamcinolone cream (KENALOG) 0.1 %  2. Neoplasm of skin (2) Left Shoulder - Anterior Skin / nail biopsy  Specimen 1 - Surgical pathology Differential Diagnosis: D48.5 R/O Atopic Neurodermatitis Check Margins: No Pink scaly patches  Left Thigh - Anterior  Skin / nail biopsy  Specimen 2 - Surgical pathology Differential Diagnosis: Z56.3 Lichen Simplex Chronicus Check Margins: No Edemateous red thick plaque  L shoulder R/O Atopic neurodermatitis  L anterior thigh R/O LSC  3. Tinea pedis of both feet bil feet/toenails Tinea unguium Feet/nails appear clear today after Lamisil 3 month treatment (05/2019-07/2019) and Diflucan 3d/wk x 1 month treatment (12/2019)  4. Allergic contact dermatitis due to other agent Hx of Contact Dermatitis with positive patch testing performed by Dr. 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  I, Othelia Pulling, RMA, am acting as scribe for Sarina Ser, MD .  Documentation: I have reviewed the above documentation for accuracy and completeness, and I agree with the above.  Sarina Ser, MD

## 2020-03-21 NOTE — Patient Instructions (Signed)

## 2020-03-28 ENCOUNTER — Ambulatory Visit (INDEPENDENT_AMBULATORY_CARE_PROVIDER_SITE_OTHER): Payer: BC Managed Care – PPO | Admitting: Dermatology

## 2020-03-28 ENCOUNTER — Telehealth: Payer: Self-pay

## 2020-03-28 ENCOUNTER — Other Ambulatory Visit: Payer: Self-pay

## 2020-03-28 DIAGNOSIS — L209 Atopic dermatitis, unspecified: Secondary | ICD-10-CM

## 2020-03-28 DIAGNOSIS — L235 Allergic contact dermatitis due to other chemical products: Secondary | ICD-10-CM

## 2020-03-28 MED ORDER — EUCRISA 2 % EX OINT
TOPICAL_OINTMENT | CUTANEOUS | 3 refills | Status: DC
Start: 2020-03-28 — End: 2020-03-29

## 2020-03-28 MED ORDER — DUPILUMAB 300 MG/2ML ~~LOC~~ SOSY
600.0000 mg | PREFILLED_SYRINGE | Freq: Once | SUBCUTANEOUS | Status: AC
  Administered 2020-03-28: 600 mg via SUBCUTANEOUS

## 2020-03-28 NOTE — Telephone Encounter (Signed)
Eucrisa PA was denied. Send in Tacrolimus.

## 2020-03-28 NOTE — Progress Notes (Addendum)
   Follow-Up Visit   Subjective  Dylan Oliver is a 44 y.o. male who presents for the following: suture removal (patient is here today to have his sutures removed and to discuss pathology results and treatment options).  The following portions of the chart were reviewed this encounter and updated as appropriate:  Tobacco  Allergies  Meds  Problems  Med Hx  Surg Hx  Fam Hx     Review of Systems:  No other skin or systemic complaints except as noted in HPI or Assessment and Plan.  Objective  Well appearing patient in no apparent distress; mood and affect are within normal limits.  A focused examination was performed including the trunk. Relevant physical exam findings are noted in the Assessment and Plan.  Objective  Trunk, extremities: Healing punch excision site of the L anterior shoulder and L thigh.  Eyes clear today.   Objective  Trunk, extremities: Clear today   Assessment & Plan  Atopic dermatitis, severe, biopsy-proven with pruritus Trunk, extremities Biopsy proven with pruritus - patient wakes up scratching; rash has been persistent for the last 20 years  Discussed condition, recommend Dupixent and discussed with patient. Dupilumab (Dupixent) is a treatment given by injection for adults with moderate-to-severe atopic dermatitis. It is given as 2 injections at the first dose followed by 1 injection ever 2 weeks thereafter.  Potential side effects include allergic reaction, herpes infections, injection site reactions and conjunctivitis (inflammation of the eyes).  Dupixent 300mg /63mL injected SQ into the R upper arm post. Dupixent 300mg /3mL injected SQ into the L upper arm post. Patient tolerated injections well. AL, CMA  Lot #[id number] Expiration date: 07/2022  Start Eucrisa apply to aa's BID since patient is allergic to topical steroids.  If not improving with Dupixent and Eucrisa consider performing another patch test. Plan to supply patient with samples for the  next few visits to see if helpful before sending in a prescription of Dupixent.   Encounter for Removal of Sutures - Incision site at the L ant shoulder and L thigh is clean, dry and intact - Wound cleansed, sutures removed, wound cleansed and steri strips applied.  - Discussed pathology results showing atopic dermatitis  - Patient advised to keep steri-strips dry until they fall off. - Scars remodel for a full year. - Once steri-strips fall off, patient can apply over-the-counter silicone scar cream each night to help with scar remodeling if desired. - Patient advised to call with any concerns or if they notice any new or changing lesions.   dupilumab (DUPIXENT) prefilled syringe 600 mg - Trunk, extremities  Other Related Medications tacrolimus (PROTOPIC) 0.1 % ointment  Allergic dermatitis due to other chemical product Trunk, extremities History of allergic contact dermatitis - patch test done at another practice + for Budesonide, p-Phenylenediamine, Hydrocortisone-17-Butyrate -  Avoid contact with these products.  Avoid topical steroids and systemic steroids.  Topical steroids he may be able to use are Topicort Return in about 2 weeks (around 04/11/2020).  Luther Redo, CMA, am acting as scribe for Sarina Ser, MD .  Documentation: I have reviewed the above documentation for accuracy and completeness, and I agree with the above.  Sarina Ser, MD

## 2020-03-28 NOTE — Telephone Encounter (Signed)
Yes.  Send Tacrolimus. thanks

## 2020-03-29 ENCOUNTER — Encounter: Payer: Self-pay | Admitting: Dermatology

## 2020-03-29 ENCOUNTER — Other Ambulatory Visit: Payer: Self-pay

## 2020-03-29 DIAGNOSIS — L209 Atopic dermatitis, unspecified: Secondary | ICD-10-CM

## 2020-03-29 MED ORDER — TACROLIMUS 0.1 % EX OINT: TOPICAL_OINTMENT | CUTANEOUS | 3 refills | Status: DC

## 2020-03-29 NOTE — Telephone Encounter (Signed)
Rx sent and pt notified.

## 2020-03-29 NOTE — Progress Notes (Signed)
Eucrisa not covered by insurance. Sending Tacrolimus.

## 2020-04-12 ENCOUNTER — Other Ambulatory Visit: Payer: Self-pay

## 2020-04-12 ENCOUNTER — Ambulatory Visit: Payer: BC Managed Care – PPO | Admitting: Dermatology

## 2020-04-26 ENCOUNTER — Encounter: Payer: Self-pay | Admitting: Dermatology

## 2020-04-26 ENCOUNTER — Other Ambulatory Visit: Payer: Self-pay

## 2020-04-26 ENCOUNTER — Ambulatory Visit: Payer: BC Managed Care – PPO | Admitting: Dermatology

## 2020-04-26 DIAGNOSIS — L209 Atopic dermatitis, unspecified: Secondary | ICD-10-CM | POA: Diagnosis not present

## 2020-04-26 MED ORDER — EUCRISA 2 % EX OINT: 1.0000 "application " | TOPICAL_OINTMENT | Freq: Two times a day (BID) | CUTANEOUS | 4 refills | Status: DC

## 2020-04-26 NOTE — Progress Notes (Signed)
   Follow-Up Visit   Subjective  Dylan Oliver is a 44 y.o. male who presents for the following: Dermatitis (Atopic severe bx proven with pruritus, trunk extremities, pt had loading dose of Dupixent  03/28/20, and currently using Tacrolimus 0.1% ointment qd/bid, pt does not think he can cont Dupixent bc his wife nor himself feel comfortable giving him a shot, improve 90% per patient).  The following portions of the chart were reviewed this encounter and updated as appropriate:  Tobacco  Allergies  Meds  Problems  Med Hx  Surg Hx  Fam Hx     Review of Systems:  No other skin or systemic complaints except as noted in HPI or Assessment and Plan.  Objective  Well appearing patient in no apparent distress; mood and affect are within normal limits.  A focused examination was performed including face, arms, trunk. Relevant physical exam findings are noted in the Assessment and Plan.  Objective  Chest - [facility]): Pink scaly patches legs   Assessment & Plan  Atopic dermatitis, severe; biopsy proven.  Improved significantly "90%" on Dupixent. Pt nor wife feel comfortable giving shots.  He may want to try using Tacrolimus alone. skin Severe, bx proven, with pruritis Improved from Dupixent loading dose/Tacrolimus Pt is allergic to topical steroids  Pt does not feel comfortable giving Dupixent injections Discussed pt coming into office for Dupixent injections q 2 weeks, and also discussed pt coming once a month for Dupixent injections.   Pt would like to just try topicals for now.  Start Eucrisa oint qd/bid aa until clear 100g, 4rfs samples x 5 Lot SFEF exp 05/24, and rebate card given.   If [name] is still too expensive, will send in rfs of Tacrolimus 0.1% oint qd/bid aa until clear, 100g 4rf  Crisaborole (EUCRISA) 2 % OINT - Chest - [facility])  Other Related Medications tacrolimus (PROTOPIC) 0.1 % ointment  Return in about 3 months (around 07/27/2020) for atopic  derm.  I, Othelia Pulling, RMA, am acting as scribe for Sarina Ser, MD .  Documentation: I have reviewed the above documentation for accuracy and completeness, and I agree with the above.  Sarina Ser, MD

## 2020-05-24 ENCOUNTER — Other Ambulatory Visit: Payer: Self-pay

## 2020-05-24 ENCOUNTER — Ambulatory Visit: Payer: BC Managed Care – PPO | Admitting: Dermatology

## 2020-05-24 ENCOUNTER — Encounter: Payer: Self-pay | Admitting: Dermatology

## 2020-05-24 DIAGNOSIS — L239 Allergic contact dermatitis, unspecified cause: Secondary | ICD-10-CM | POA: Diagnosis not present

## 2020-05-24 DIAGNOSIS — L2089 Other atopic dermatitis: Secondary | ICD-10-CM

## 2020-05-24 DIAGNOSIS — L2081 Atopic neurodermatitis: Secondary | ICD-10-CM

## 2020-05-24 MED ORDER — DUPIXENT 300 MG/2ML ~~LOC~~ SOAJ
300.0000 mg | SUBCUTANEOUS | 5 refills | Status: DC
Start: 2020-05-24 — End: 2020-08-21

## 2020-05-24 MED ORDER — DUPILUMAB 300 MG/2ML ~~LOC~~ SOSY
300.0000 mg | PREFILLED_SYRINGE | Freq: Once | SUBCUTANEOUS | Status: AC
  Administered 2020-05-24: 300 mg via SUBCUTANEOUS

## 2020-05-24 MED ORDER — HYDROXYZINE HCL 25 MG PO TABS
ORAL_TABLET | ORAL | 1 refills | Status: DC
Start: 2020-05-24 — End: 2024-02-27

## 2020-05-24 NOTE — Patient Instructions (Signed)
Dupilumab (Dupixent) is a treatment given by injection for adults with moderate-to-severe atopic dermatitis. Goal is control of skin condition, not cure. It is given as 2 injections at the first dose followed by 1 injection ever 2 weeks thereafter.  Potential side effects include allergic reaction, herpes infections, injection site reactions and conjunctivitis (inflammation of the eyes).  The use of Dupixent requires long term medication management, including periodic office visits.  Continue Eucrisa once daily Continue tacrolimus once daily.  Once improved may discontinue tacrolimus.

## 2020-05-24 NOTE — Progress Notes (Addendum)
   Follow-Up Visit   Subjective  Dylan Oliver is a 44 y.o. male who presents for the following: Rash.  Patient accompanied by wife.   Patient here today for itchy rash all over body. Present for about 4 days. No new soaps or detergents. No recent illnesses. He is using tacrolimus and Eucrisa with some help. He saw Dr. Nehemiah Massed a few months ago and was diagnosed with biopsy proven dermatitis. He was given loading dose of Dupixent on 03/28/20 but does not want to continue taking.   In the 90's patient was told he was allergic to something used to dye leather. He has since stopped wearing leather belts, carrying leather wallet. Patient was seen almost 2 years ago by another dermatologist for patch testing. He reacted to p-Phenylenediamine, Budesonide, Hydrocortisone-17-Butyrate.  Patient advises his reactions to certain triggers have not been consistent.   The following portions of the chart were reviewed this encounter and updated as appropriate:  Tobacco  Allergies  Meds  Problems  Med Hx  Surg Hx  Fam Hx      Review of Systems:  No other skin or systemic complaints except as noted in HPI or Assessment and Plan.  Objective  Well appearing patient in no apparent distress; mood and affect are within normal limits.  A focused examination was performed including hands, chest, legs, back, buttocks. Relevant physical exam findings are noted in the Assessment and Plan.  Objective  Trunk, extremities: Widespread scaly pink papules coalescing to plaques affecting 30+% BSA   Assessment & Plan  Allergic dermatitis trunk, extremities  Known allergic contact dermatitis to p-Phenylenediamine, Budesonide, Hydrocortisone-17-Butyrate.   Will refer to [facility] patch testing to assess for possible additional allergens    Ordered Medications: hydrOXYzine (ATARAX/VISTARIL) 25 MG tablet  Other atopic dermatitis Trunk, extremities  Chronic, severe, flared.  Biopsy consistent with atopic  dermatitis.  Patient previously did trial of Dupixent with good clearance.  However, he does not like injections and so would like to know if there are other good treatment options.    Discussed treatment options with NVUVB, methotrexate.  Reviewed need for regular lab monitoring with methotrexate and risk of liver inflammation, bone marrow suppression, immunosupression.   Recommend patient continue treating with Dupixent.  Recommend patient be referred for further patch testing at [facility].   Continue Eucrisa qam Continue tacrolimus qpm until improved, then stop and use only [place]. Start hydroxyzine 25mg  1-2 nightly as needed.  Reviewed risk of sedation.  Advised not to combine with Benadryl or alcohol.  Do not drive or operate heavy machinery while taking.  For short-term use only.  Restart Dupixent 300mg  every 2 weeks Dupixent 300mg /75mL injected SQ left lower abdomen. Patient tolerated well.    Return in about 2 weeks (around 06/07/2020) for [place] follow up and injection.  Graciella Belton, RMA, am acting as scribe for Forest Gleason, MD .  Documentation: I have reviewed the above documentation for accuracy and completeness, and I agree with the above.  Forest Gleason, MD

## 2020-05-25 ENCOUNTER — Other Ambulatory Visit: Payer: Self-pay

## 2020-05-25 DIAGNOSIS — L2081 Atopic neurodermatitis: Secondary | ICD-10-CM

## 2020-05-29 ENCOUNTER — Telehealth: Payer: Self-pay

## 2020-05-29 NOTE — Telephone Encounter (Signed)
Dylan Oliver is requesting BSA% for the prior authorization for [place]. Can this be added to his last note please.

## 2020-05-29 NOTE — Telephone Encounter (Signed)
Added.  Thank you.

## 2020-06-07 ENCOUNTER — Ambulatory Visit: Payer: BC Managed Care – PPO | Admitting: Dermatology

## 2020-06-07 ENCOUNTER — Other Ambulatory Visit: Payer: Self-pay

## 2020-06-07 DIAGNOSIS — L2089 Other atopic dermatitis: Secondary | ICD-10-CM

## 2020-06-07 DIAGNOSIS — L235 Allergic contact dermatitis due to other chemical products: Secondary | ICD-10-CM | POA: Diagnosis not present

## 2020-06-07 MED ORDER — DESOXIMETASONE 0.25 % EX CREA: 1.0000 "application " | TOPICAL_CREAM | Freq: Two times a day (BID) | CUTANEOUS | 0 refills | Status: DC

## 2020-06-07 MED ORDER — DUPILUMAB 300 MG/2ML ~~LOC~~ SOSY
300.0000 mg | PREFILLED_SYRINGE | Freq: Once | SUBCUTANEOUS | Status: AC
  Administered 2020-06-07: 300 mg via SUBCUTANEOUS

## 2020-06-07 NOTE — Progress Notes (Signed)
Follow-Up Visit   Subjective  Dylan Oliver is a 44 y.o. male who presents for the following: Dermatitis (Patient here today for 2 week atopic dermatitis follow up. At last appointment he was given a Dupixent injection and is using Eucrisa and tacrolimus. ).  Patient has noticed some improvement but has flared at left upper arm.   The following portions of the chart were reviewed this encounter and updated as appropriate:   Tobacco   Allergies   Meds   Problems   Med Hx   Surg Hx   Fam Hx       Review of Systems:  No other skin or systemic complaints except as noted in HPI or Assessment and Plan.  Objective  Well appearing patient in no apparent distress; mood and affect are within normal limits.  A focused examination was performed including face, neck, chest and back and arms, legs. Relevant physical exam findings are noted in the Assessment and Plan.  Objective  back, arms, chest: Scaly pink papules coalescing to plaques at arms, chest, back, buttocks and legs.    Assessment & Plan  Other atopic dermatitis back, arms, chest  Chronic condition, no cure, only control. Currently flared with slight improvement.  Atopic dermatitis (eczema) is a chronic, relapsing, pruritic condition that can significantly affect quality of life. It is often associated with allergic rhinitis and/or asthma and can require treatment with topical medications, phototherapy, or in severe cases a biologic medication called Dupixent.   Continue tacrolimus once in the evening until improved. Continue Eucrisa in the mornings.  Continue Dupixent 300mg /34mL sq q2weeks Patient injected with Dupixent 300mg /50mL to right lower abdomen. He will wait until rx for pre-filled pen is filled to self-inject.   Tolerating Dupixent well without herpesviral infection, conjunctivitis or injection site reaction.   Pt with allergic contact dermatitis to multiple topical steroids. Start desoximetasone 0.25% cream twice daily  up to 3 weeks then weekends only. Avoid applying to face, groin, and axilla. Use as directed. Risk of skin atrophy with long-term use reviewed.   Topical steroids (such as triamcinolone, fluocinolone, fluocinonide, mometasone, clobetasol, halobetasol, betamethasone, hydrocortisone) can cause thinning and lightening of the skin if they are used for too long in the same area. Your physician has selected the right strength medicine for your problem and area affected on the body. Please use your medication only as directed by your physician to prevent side effects.   Dupilumab (Dupixent) is a treatment given by injection for adults with moderate-to-severe atopic dermatitis. Goal is control of skin condition, not cure. It is given as 2 injections at the first dose followed by 1 injection ever 2 weeks thereafter.  Potential side effects include allergic reaction, herpes infections, injection site reactions and conjunctivitis (inflammation of the eyes).  The use of Dupixent requires long term medication management, including periodic office visits.  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   Known allergic contact dermatitis to p-Phenylenediamine, Budesonide, Hydrocortisone-17-Butyrate.   Start desoximetasone 0.25% cream twice daily up to 3 weeks then weekends only. Avoid applying to face, groin, and axilla. Use as directed. Risk of skin atrophy with long-term use reviewed.   Return in about 2 weeks (around 06/21/2020) for [place].  Graciella Belton, RMA, am acting as scribe for Forest Gleason, MD .  Documentation: I have reviewed the above documentation for accuracy and completeness, and I agree with the above.  Forest Gleason, MD

## 2020-06-07 NOTE — Patient Instructions (Addendum)
Dupilumab (Dupixent) is a treatment given by injection for adults with moderate-to-severe atopic dermatitis. Goal is control of skin condition, not cure. It is given as 2 injections at the first dose followed by 1 injection ever 2 weeks thereafter.  Potential side effects include allergic reaction, herpes infections, injection site reactions and conjunctivitis (inflammation of the eyes).  The use of Dupixent requires long term medication management, including periodic office visits.  

## 2020-06-19 ENCOUNTER — Encounter: Payer: Self-pay | Admitting: Dermatology

## 2020-06-21 ENCOUNTER — Other Ambulatory Visit: Payer: Self-pay

## 2020-06-21 ENCOUNTER — Ambulatory Visit (INDEPENDENT_AMBULATORY_CARE_PROVIDER_SITE_OTHER): Payer: BC Managed Care – PPO

## 2020-06-21 DIAGNOSIS — L209 Atopic dermatitis, unspecified: Secondary | ICD-10-CM

## 2020-06-22 NOTE — Progress Notes (Signed)
Patient here for two week Dupixent injection.  Patient self injected into left lower abdomen subcutaneously. Patient tolerated well.   Patient did complain of nerve pain in the bottom of feet. Discussed with Dr. Laurence Ferrari and she believes this is not related to [place]. Patient shows no sign of rash or skin involvement. Patient advised to discuss with PCP, but if symptoms become worse including a rash of anything coming to the surface of the skin to call us and we can get him scheduled.

## 2020-06-27 ENCOUNTER — Telehealth: Payer: Self-pay

## 2020-06-27 NOTE — Telephone Encounter (Signed)
Left patient message following up on referral to [facility] for allergy testing, JS

## 2020-07-26 ENCOUNTER — Ambulatory Visit: Payer: BC Managed Care – PPO | Admitting: Dermatology

## 2020-07-31 ENCOUNTER — Ambulatory Visit: Payer: BC Managed Care – PPO | Admitting: Dermatology

## 2020-08-02 ENCOUNTER — Other Ambulatory Visit: Payer: Self-pay

## 2020-08-02 ENCOUNTER — Encounter: Payer: Self-pay | Admitting: Dermatology

## 2020-08-02 ENCOUNTER — Ambulatory Visit: Payer: BC Managed Care – PPO | Admitting: Dermatology

## 2020-08-02 DIAGNOSIS — L74513 Primary focal hyperhidrosis, soles: Secondary | ICD-10-CM | POA: Diagnosis not present

## 2020-08-02 DIAGNOSIS — L209 Atopic dermatitis, unspecified: Secondary | ICD-10-CM | POA: Diagnosis not present

## 2020-08-02 DIAGNOSIS — L74519 Primary focal hyperhidrosis, unspecified: Secondary | ICD-10-CM

## 2020-08-02 MED ORDER — ERYTHROMYCIN 2 % EX GEL: Freq: Two times a day (BID) | CUTANEOUS | 1 refills | Status: DC

## 2020-08-02 NOTE — Patient Instructions (Addendum)
Topical steroids (such as triamcinolone, fluocinolone, fluocinonide, mometasone, clobetasol, halobetasol, betamethasone, hydrocortisone) can cause thinning and lightening of the skin if they are used for too long in the same area. Your physician has selected the right strength medicine for your problem and area affected on the body. Please use your medication only as directed by your physician to prevent side effects.   Start Odaban spray apply to areas of feet daily to help with sweating and smell.

## 2020-08-02 NOTE — Progress Notes (Signed)
Follow-Up Visit   Subjective  Dylan Oliver is a 45 y.o. male who presents for the following: Follow-up (Patient here today to follow up on atopic dermatitis. States feels has gotten a lot better but still with stubborn areas at leg and buttock. Still using eucrisa, dupixent, hydroxyzine, and tacrolimus. Patient has concerns today about his feet smelling bad. Patient complains of excessive sweating in feet. Patient states when he started dupixent shots feet started hurting and issue started at that time. ).  The following portions of the chart were reviewed this encounter and updated as appropriate:  Tobacco  Allergies  Meds  Problems  Med Hx  Surg Hx  Fam Hx       Objective  Well appearing patient in no apparent distress; mood and affect are within normal limits.  A focused examination was performed including gluteal crease, legs, bilateral feet, back. Relevant physical exam findings are noted in the Assessment and Plan.  Objective  Gluteal Crease, left leg: Thick scaly pink plaques  Objective  Right Foot - Anterior: Feet with sheen, minimal erythema  Assessment & Plan  Atopic dermatitis, unspecified type Gluteal Crease, left leg  Chronic condition with duration over one year. Condition is bothersome to patient. Not currently at goal.  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 Avoid applying to face, groin, and axilla. Use as directed. Risk of skin atrophy with long-term use reviewed.   Atopic dermatitis (eczema) is a chronic, relapsing, pruritic condition that can significantly affect quality of life. It is often associated with allergic rhinitis and/or asthma and can require treatment with topical medications, phototherapy, or in severe cases a biologic medication called Dupixent.    Continue tacrolimus once in the evening until  improved to milder areas that are not clear Continue Eucrisa in the mornings to milder areas that are not clear  Continue Dupixent 300mg /79mL sq q2weeks  Patient injected with Dupixent 300mg /13mL to right lower abdomen. He will wait until rx for pre-filled pen is filled to self-inject.   Tolerating Dupixent well without herpesviral infection, conjunctivitis or injection site reaction.     Topical steroids (such as triamcinolone, fluocinolone, fluocinonide, mometasone, clobetasol, halobetasol, betamethasone, hydrocortisone) can cause thinning and lightening of the skin if they are used for too long in the same area. Your physician has selected the right strength medicine for your problem and area affected on the body. Please use your medication only as directed by your physician to prevent side effects.    Dupilumab (Dupixent) is a treatment given by injection for adults with moderate-to-severe atopic dermatitis. Goal is control of skin condition, not cure. It is given as 2 injections at the first dose followed by 1 injection ever 2 weeks thereafter.   Potential side effects include allergic reaction, herpes infections, injection site reactions and conjunctivitis (inflammation of the eyes).  The use of Dupixent requires long term medication management, including periodic office visits.     Other Related Medications tacrolimus (PROTOPIC) 0.1 % ointment Crisaborole (EUCRISA) 2 % OINT  Primary focal hyperhidrosis Right Foot - Anterior  Feet with increased sweating and with odor  Start odaban spray up to once a day for hyperhidrosis  Start erythromycin gel twice a day for odor  Ordered Medications: erythromycin with ethanol (EMGEL) 2 % gel  Return in about 30 days (around 09/01/2020) for atopic dermatitis and  hydrosis .  I, Ruthell Rummage, CMA, am acting as scribe for Forest Gleason, MD.  Documentation: I have reviewed the above documentation for accuracy and completeness, and I agree with  the above.  Forest Gleason, MD

## 2020-08-21 ENCOUNTER — Other Ambulatory Visit: Payer: Self-pay

## 2020-08-21 DIAGNOSIS — L2081 Atopic neurodermatitis: Secondary | ICD-10-CM

## 2020-08-21 MED ORDER — DUPIXENT 300 MG/2ML ~~LOC~~ SOAJ: 300.0000 mg | SUBCUTANEOUS | 5 refills | Status: DC

## 2020-08-21 NOTE — Progress Notes (Signed)
Fax from [facility] specialty requesting new rx

## 2020-09-14 ENCOUNTER — Encounter: Payer: Self-pay | Admitting: Dermatology

## 2020-09-14 ENCOUNTER — Ambulatory Visit: Payer: BC Managed Care – PPO | Admitting: Dermatology

## 2020-09-14 ENCOUNTER — Other Ambulatory Visit: Payer: Self-pay

## 2020-09-14 DIAGNOSIS — D2261 Melanocytic nevi of right upper limb, including shoulder: Secondary | ICD-10-CM

## 2020-09-14 DIAGNOSIS — L2089 Other atopic dermatitis: Secondary | ICD-10-CM

## 2020-09-14 DIAGNOSIS — D239 Other benign neoplasm of skin, unspecified: Secondary | ICD-10-CM

## 2020-09-14 DIAGNOSIS — D2361 Other benign neoplasm of skin of right upper limb, including shoulder: Secondary | ICD-10-CM | POA: Diagnosis not present

## 2020-09-14 DIAGNOSIS — L209 Atopic dermatitis, unspecified: Secondary | ICD-10-CM

## 2020-09-14 DIAGNOSIS — L2389 Allergic contact dermatitis due to other agents: Secondary | ICD-10-CM | POA: Diagnosis not present

## 2020-09-14 DIAGNOSIS — L821 Other seborrheic keratosis: Secondary | ICD-10-CM

## 2020-09-14 DIAGNOSIS — D229 Melanocytic nevi, unspecified: Secondary | ICD-10-CM

## 2020-09-14 MED ORDER — DESOXIMETASONE 0.25 % EX CREA: 1.0000 "application " | TOPICAL_CREAM | Freq: Every day | CUTANEOUS | 2 refills | Status: DC

## 2020-09-14 MED ORDER — EUCRISA 2 % EX OINT: 1.0000 "application " | TOPICAL_OINTMENT | Freq: Every day | CUTANEOUS | 3 refills | Status: DC

## 2020-09-14 NOTE — Progress Notes (Signed)
Follow-Up Visit   Subjective  Dylan Oliver is a 45 y.o. male who presents for the following: Follow-up (Patient here today for 6 week atopic dermatitis follow up. Patient is on [name] and is using tacrolimus, Eucrisa and desoximetasone. Patient advises he is improving, has a little itching at lower back. He also did have patch testing done recently at [name]. ).  [facility] patch testing showed reactions to       [place]  Benzyl Alcohol  Clobetasol Propionate  Nickel Sulfate  Potassium Dichromate   The following portions of the chart were reviewed this encounter and updated as appropriate:   Tobacco  Allergies  Meds  Problems  Med Hx  Surg Hx  Fam Hx      Review of Systems:  No other skin or systemic complaints except as noted in HPI or Assessment and Plan.  Objective  Well appearing patient in no apparent distress; mood and affect are within normal limits.  A focused examination was performed including face, neck, chest and back and buttocks, thighs, arms. Relevant physical exam findings are noted in the Assessment and Plan.  Objective  buttocks, thighs: Scaly pink plaques left buttock, right buttock, left thigh  Objective  Right upper arm: 0.4cm dark brown thin papule, slightly darker centrally  Images    Objective  Right Upper Arm: Firm pink/brown papulenodule with dimple sign.   Objective  Left Thigh - Anterior: Scaly pink plaque   Assessment & Plan  Other atopic dermatitis buttocks, thighs  Chronic condition with duration over one year. Condition is bothersome to patient. Not currently at goal.  Doreatha Massed once daily Restart tacrolimus once daily  May continue desoximetasone daily weekends only as needed. Avoid applying to face, groin, and axilla. Use as directed. Risk of skin atrophy with long-term use reviewed.   Continue Dupixent q2weeks   Topical steroids (such as triamcinolone, fluocinolone, fluocinonide, mometasone,  clobetasol, halobetasol, betamethasone, hydrocortisone) can cause thinning and lightening of the skin if they are used for too long in the same area. Your physician has selected the right strength medicine for your problem and area affected on the body. Please use your medication only as directed by your physician to prevent side effects.   Dupilumab (Dupixent) is a treatment given by injection for adults with moderate-to-severe atopic dermatitis. Goal is control of skin condition, not cure. It is given as 2 injections at the first dose followed by 1 injection ever 2 weeks thereafter.  Potential side effects include allergic reaction, herpes infections, injection site reactions and conjunctivitis (inflammation of the eyes).  The use of Dupixent requires long term medication management, including periodic office visits.  Pt will call if worsening or failing to clear before next visit  Other Related Medications desoximetasone (TOPICORT) 0.25 % cream  Nevus Right upper arm  Benign-appearing.  Observation.  Call clinic for new or changing lesions.  Recommend daily use of broad spectrum spf 30+ sunscreen to sun-exposed areas.    Dermatofibroma Right Upper Arm  Benign-appearing.  Observation.  Call clinic for new or changing lesions.  Recommend daily use of broad spectrum spf 30+ sunscreen to sun-exposed areas.    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  Positive patch test to  Balsam of [place]  Benzyl Alcohol  Clobetasol Propionate  Nickel Sulfate  Potassium Dichromate  Reviewed availability of Nickel test kits.  Seborrheic Keratoses - Stuck-on, waxy, tan-brown papules and plaques  - Discussed  benign etiology and prognosis. - Observe - Call for any changes  Melanocytic Nevi - Tan-brown and/or pink-flesh-colored symmetric macules and papules - Benign appearing on exam today - Observation - Call clinic for new or changing  moles - Recommend daily use of broad spectrum spf 30+ sunscreen to sun-exposed areas.   Return in about 6 months (around 03/17/2021) for atopic dermatitis.  Graciella Belton, RMA, am acting as scribe for Forest Gleason, MD .  Documentation: I have reviewed the above documentation for accuracy and completeness, and I agree with the above.  Forest Gleason, MD

## 2020-09-14 NOTE — Patient Instructions (Addendum)
Dupilumab (Dupixent) is a treatment given by injection for adults with moderate-to-severe atopic dermatitis. Goal is control of skin condition, not cure. It is given as 2 injections at the first dose followed by 1 injection ever 2 weeks thereafter.  Potential side effects include allergic reaction, herpes infections, injection site reactions and conjunctivitis (inflammation of the eyes).  The use of Dupixent requires long term medication management, including periodic office visits.   Topical steroids (such as triamcinolone, fluocinolone, fluocinonide, mometasone, clobetasol, halobetasol, betamethasone, hydrocortisone) can cause thinning and lightening of the skin if they are used for too long in the same area. Your physician has selected the right strength medicine for your problem and area affected on the body. Please use your medication only as directed by your physician to prevent side effects.    Continue Eucrisa once daily Continue tacrolimus once daily  May continue desoximetasone daily weekends only as needed. Avoid applying to face, groin, and axilla. Use as directed. Risk of skin atrophy with long-term use reviewed.  Continue Dupixent 300mg /66mL every 2 weeks  Recommend taking Heliocare sun protection supplement daily in sunny weather for additional sun protection. For maximum protection on the sunniest days, you can take up to 2 capsules of regular Heliocare OR take 1 capsule of Heliocare Ultra. For prolonged exposure (such as a full day in the sun), you can repeat your dose of the supplement 4 hours after your first dose. Heliocare can be purchased at [facility] or at [email or web address].

## 2021-02-06 ENCOUNTER — Other Ambulatory Visit: Payer: Self-pay | Admitting: Dermatology

## 2021-02-06 DIAGNOSIS — L2081 Atopic neurodermatitis: Secondary | ICD-10-CM

## 2021-02-22 ENCOUNTER — Ambulatory Visit: Payer: BC Managed Care – PPO | Admitting: Dermatology

## 2021-02-22 ENCOUNTER — Other Ambulatory Visit: Payer: Self-pay

## 2021-02-22 DIAGNOSIS — L409 Psoriasis, unspecified: Secondary | ICD-10-CM | POA: Diagnosis not present

## 2021-02-22 DIAGNOSIS — L235 Allergic contact dermatitis due to other chemical products: Secondary | ICD-10-CM

## 2021-02-22 DIAGNOSIS — L209 Atopic dermatitis, unspecified: Secondary | ICD-10-CM | POA: Diagnosis not present

## 2021-02-22 MED ORDER — TACROLIMUS 0.1 % EX OINT: TOPICAL_OINTMENT | CUTANEOUS | 1 refills | Status: DC

## 2021-02-22 MED ORDER — VTAMA 1 % EX CREA: 1.0000 "application " | TOPICAL_CREAM | Freq: Every morning | CUTANEOUS | 1 refills | Status: DC

## 2021-02-22 NOTE — Progress Notes (Signed)
Follow-Up Visit   Subjective  Dylan Oliver is a 45 y.o. male who presents for the following: Rash (Patient here today for rash at back of neck and buttocks. Rash started worsening a few weeks ago. He has been using [place] and is on Dupixent for Atopic Dermatitis. The [name] is not helping at all at post neck, buttocks. Also with some itchiness at scalp. ).  The following portions of the chart were reviewed this encounter and updated as appropriate:   Tobacco  Allergies  Meds  Problems  Med Hx  Surg Hx  Fam Hx      Review of Systems:  No other skin or systemic complaints except as noted in HPI or Assessment and Plan.  Objective  Well appearing patient in no apparent distress; mood and affect are within normal limits.  A focused examination was performed including scalp, trunk, extremities. Relevant physical exam findings are noted in the Assessment and Plan.  post neck, scalp, trunk, extremities Lichenified red scaly plaque of the L buttock, scaly pink plaque of the L post thigh, nummular scaly pink plaques of R hip.   Thighs, buttocks Clear today  Trunk, extremities    Assessment & Plan  Psoriasis post neck, scalp, trunk, extremities  Chronic condition with duration or expected duration over one year. Condition is bothersome to patient. Currently flared.  Psoriasis is a chronic non-curable, but treatable genetic/hereditary disease that may have other systemic features affecting other organ systems such as joints (Psoriatic Arthritis). It is associated with an increased risk of inflammatory bowel disease, heart disease, non-alcoholic fatty liver disease, and depression.    No reports of joint aches or pain.   If labs + for strep infection plan to treat, though this may or may not help clear the psoriasis.  Start Vtama cream apply to aa's QAM PRN. If not responding to treatment consider other topical treatment and/or light treatment.   Start Tacrolimus to aa's QHS  PRN.   Antistreptolysin O titer - post neck, scalp, trunk, extremities  tacrolimus (PROTOPIC) 0.1 % ointment - post neck, scalp, trunk, extremities Apply to aa's rash QHS.  Tapinarof (VTAMA) 1 % CREA - post neck, scalp, trunk, extremities Apply 1 application topically in the morning.  Atopic dermatitis, unspecified type Thighs, buttocks  Chronic condition with duration or expected duration over one year. Currently well-controlled.  Atopic dermatitis (eczema) is a chronic, relapsing, pruritic condition that can significantly affect quality of life. It is often associated with allergic rhinitis and/or asthma and can require treatment with topical medications, phototherapy, or in severe cases a biologic medication called Dupixent in children and adults.   Continue Dupixent SQ QOW. Dupilumab (Dupixent) is a treatment given by injection for adults with moderate-to-severe atopic dermatitis. Goal is control of skin condition, not cure. It is given as 2 injections at the first dose followed by 1 injection ever 2 weeks thereafter.  Potential side effects include allergic reaction, herpes infections, injection site reactions and conjunctivitis (inflammation of the eyes).  The use of Dupixent requires long term medication management, including periodic office visits.  Chronic condition with duration over one year. Condition is bothersome to patient. Not currently at goal.   Continue Eucrisa once daily May continue Desoximetasone daily weekends only as needed. Avoid applying to face, groin, and axilla. Use as directed. Risk of skin atrophy with long-term use reviewed.      Related Medications tacrolimus (PROTOPIC) 0.1 % ointment Apply to aa's eczema BID PRN.  Crisaborole (EUCRISA)  2 % OINT Apply 1 application topically daily.  Allergic dermatitis due to other chemical product Trunk, extremities  Chronic condition.  Positive patch test to  Balsam of [place]  Benzyl Alcohol  Clobetasol  Propionate  Nickel Sulfate  Potassium Dichromate  Avoid contact with products.  Return for appointment as scheduled.  Luther Redo, CMA, am acting as scribe for Forest Gleason, MD .  Documentation: I have reviewed the above documentation for accuracy and completeness, and I agree with the above.  Forest Gleason, MD

## 2021-02-22 NOTE — Patient Instructions (Signed)

## 2021-02-23 LAB — ANTISTREPTOLYSIN O TITER: ASO: 407 IU/mL — ABNORMAL HIGH (ref 0.0–200.0)

## 2021-02-27 ENCOUNTER — Telehealth: Payer: Self-pay | Admitting: Dermatology

## 2021-02-27 ENCOUNTER — Telehealth: Payer: Self-pay

## 2021-02-27 ENCOUNTER — Other Ambulatory Visit: Payer: Self-pay

## 2021-02-27 ENCOUNTER — Encounter: Payer: Self-pay | Admitting: Dermatology

## 2021-02-27 MED ORDER — AMOXICILLIN 875 MG PO TABS: 875.0000 mg | ORAL_TABLET | Freq: Two times a day (BID) | ORAL | 0 refills | Status: AC

## 2021-02-27 NOTE — Telephone Encounter (Signed)
Patient advised culture results ASO titer positive, recommend erythromycin '500mg'$  BID x 7 days.Cherly Hensen

## 2021-02-27 NOTE — Telephone Encounter (Signed)
-----   Message from [place], MD sent at 02/27/2021 10:50 AM EDT ----- ASO titer positive, suggestive of current or recent streptococcal infection. This is a known trigger for guttate psoriasis. It is possible that treating for Streptococcal infection may help the psoriasis improve faster, though it also may not help. Recommend taking erythromycin 500 mg twice a day for 7 days to treat for Streptococcal bacteria in hopes this may help clear the psoriasis/make it more responsive to therapy.  MAs please call. Thank you!

## 2021-02-27 NOTE — Progress Notes (Signed)
Sent in amoxicillin '875mg'$  1 PO BID x 7 days.Cherly Hensen

## 2021-03-14 ENCOUNTER — Ambulatory Visit: Payer: BC Managed Care – PPO | Admitting: Dermatology

## 2021-03-20 ENCOUNTER — Other Ambulatory Visit: Payer: Self-pay

## 2021-03-20 ENCOUNTER — Ambulatory Visit: Payer: BC Managed Care – PPO | Admitting: Dermatology

## 2021-03-20 DIAGNOSIS — L409 Psoriasis, unspecified: Secondary | ICD-10-CM | POA: Diagnosis not present

## 2021-03-20 DIAGNOSIS — D224 Melanocytic nevi of scalp and neck: Secondary | ICD-10-CM | POA: Diagnosis not present

## 2021-03-20 DIAGNOSIS — D229 Melanocytic nevi, unspecified: Secondary | ICD-10-CM

## 2021-03-20 DIAGNOSIS — L2089 Other atopic dermatitis: Secondary | ICD-10-CM

## 2021-03-20 DIAGNOSIS — L209 Atopic dermatitis, unspecified: Secondary | ICD-10-CM | POA: Diagnosis not present

## 2021-03-20 MED ORDER — DESOXIMETASONE 0.25 % EX CREA: 1.0000 "application " | TOPICAL_CREAM | Freq: Every day | CUTANEOUS | 2 refills | Status: DC

## 2021-03-20 NOTE — Progress Notes (Signed)
Follow-Up Visit   Subjective  Dylan Oliver is a 45 y.o. male who presents for the following: Follow-up (F/u for psoriasis and atopic dermatitis. Pt treating psoriasis with vtama and tacrolimus. States that there has been some improvement at the neck. Pt treating AD with dupixent qow - due tomorrow for injection - desoximetasone and Eucrisa. Pt reports no improvement at buttocks but has not been using Vtama or tacrolimus there).  The following portions of the chart were reviewed this encounter and updated as appropriate:  Tobacco  Allergies  Meds  Problems  Med Hx  Surg Hx  Fam Hx      Review of Systems: No other skin or systemic complaints except as noted in HPI or Assessment and Plan.   Objective  Well appearing patient in no apparent distress; mood and affect are within normal limits.  A focused examination was performed including neck, buttocks. Relevant physical exam findings are noted in the Assessment and Plan.  neck, buttocks Scaly pink plaques left post neck, buttocks  Left posterior neck Tan-brown and/or pink-flesh-colored symmetric macules and papules.   Left Thigh - Anterior Clear today  Assessment & Plan  Psoriasis neck, buttocks  ASO titer positive, treated with erythromycin (discussed prior to treatment that it may or may not make psoriasis easier to treat)  Continue Vtama BID to affected areas for up to a month Continue Tacrolimus BID to affected areas for up to a month Start Desoximetasone BID to affected areas for up to a month. Avoid applying to face, groin, and axilla. Use as directed. Risk of skin atrophy with long-term use reviewed.   Topical steroids (such as triamcinolone, fluocinolone, fluocinonide, mometasone, clobetasol, halobetasol, betamethasone, hydrocortisone) can cause thinning and lightening of the skin if they are used for too long in the same area. Your physician has selected the right strength medicine for your problem and area  affected on the body. Please use your medication only as directed by your physician to prevent side effects.   Psoriasis is a chronic non-curable, but treatable genetic/hereditary disease that may have other systemic features affecting other organ systems such as joints (Psoriatic Arthritis). It is associated with an increased risk of inflammatory bowel disease, heart disease, non-alcoholic fatty liver disease, and depression.     Related Medications tacrolimus (PROTOPIC) 0.1 % ointment Apply to aa's rash QHS.  Tapinarof (VTAMA) 1 % CREA Apply 1 application topically in the morning.  Other atopic dermatitis  Related Medications desoximetasone (TOPICORT) 0.25 % cream Apply 1 application topically daily. Daily as needed, weekends only. Avoid applying to face, groin, and axilla. Use as directed. Risk of skin atrophy with long-term use reviewed.  Nevus Left posterior neck  Benign-appearing Itchy  Recheck at f/u to see if itching has calmed when psoriasis flare calms with new treatment. Consider shave removal if still bothersome.   Atopic dermatitis, unspecified type Left Thigh - Anterior  Chronic condition with duration or expected duration over one year. Currently well-controlled.  Atopic dermatitis - Severe, on Dupixent (biologic medication).  Atopic dermatitis (eczema) is a chronic, relapsing, pruritic condition that can significantly affect quality of life. It is often associated with allergic rhinitis and/or asthma and can require treatment with topical medications, phototherapy, or in severe cases a biologic medication called Dupixent.   Continue Dupixent QOW.   Related Medications tacrolimus (PROTOPIC) 0.1 % ointment Apply to aa's eczema BID PRN.  Crisaborole (EUCRISA) 2 % OINT Apply 1 application topically daily.  Return in about 1 month (  around 04/19/2021) for psoriasis f/u.  I, Harriett Sine, CMA, am acting as scribe for Forest Gleason, MD.  Documentation: I have  reviewed the above documentation for accuracy and completeness, and I agree with the above.  Forest Gleason, MD

## 2021-03-20 NOTE — Patient Instructions (Signed)
Continue Vtama BID to affected areas for up to a month Continue Tacrolimus BID to affected areas for up to a month Start Desoximetasone BID to affected areas for up to a month

## 2021-03-28 ENCOUNTER — Encounter: Payer: Self-pay | Admitting: Dermatology

## 2021-04-24 ENCOUNTER — Encounter: Payer: Self-pay | Admitting: Dermatology

## 2021-04-24 ENCOUNTER — Other Ambulatory Visit: Payer: Self-pay

## 2021-04-24 ENCOUNTER — Ambulatory Visit: Payer: BC Managed Care – PPO | Admitting: Dermatology

## 2021-04-24 DIAGNOSIS — D485 Neoplasm of uncertain behavior of skin: Secondary | ICD-10-CM

## 2021-04-24 DIAGNOSIS — D224 Melanocytic nevi of scalp and neck: Secondary | ICD-10-CM | POA: Diagnosis not present

## 2021-04-24 DIAGNOSIS — L308 Other specified dermatitis: Secondary | ICD-10-CM | POA: Diagnosis not present

## 2021-04-24 DIAGNOSIS — L409 Psoriasis, unspecified: Secondary | ICD-10-CM | POA: Diagnosis not present

## 2021-04-24 NOTE — Progress Notes (Signed)
Follow-Up Visit   Subjective  Dylan Oliver is a 45 y.o. male who presents for the following: Psoriasis (Patient here today for 1 month psoriasis follow up. Currently using Vtama, tacrolimus and desoximetasone 1-2 times daily. Patient advises psoriasis is improved. ). Eczema doing well on dupixent. No problems with medication.  The following portions of the chart were reviewed this encounter and updated as appropriate:   Tobacco  Allergies  Meds  Problems  Med Hx  Surg Hx  Fam Hx      Review of Systems:  No other skin or systemic complaints except as noted in HPI or Assessment and Plan.  Objective  Well appearing patient in no apparent distress; mood and affect are within normal limits.  A focused examination was performed including neck, back, buttocks. Relevant physical exam findings are noted in the Assessment and Plan.  Neck - Posterior Thin erythematous plaques at neck, buttock, left thigh  left posterior neck 0.6 cm erythematous papule R/o Irritated Nevus vs other  Neck - Posterior clear   Assessment & Plan  Psoriasis Neck - Posterior  Chronic condition with duration or expected duration over one year. Condition is bothersome to patient. Nearly cleared but with a few spots still active and one new spot that's itchy.  Continue Vtama 1-2 times daily as needed.  Continue desoximetasone decreasing to 1-2 times per day on weekends only. Avoid applying to face, groin, and axilla. Use as directed. Long-term use can cause thinning of the skin.  Restart tacrolimus 1-2 times daily as needed.  Psoriasis is a chronic non-curable, but treatable genetic/hereditary disease that may have other systemic features affecting other organ systems such as joints (Psoriatic Arthritis). It is associated with an increased risk of inflammatory bowel disease, heart disease, non-alcoholic fatty liver disease, and depression.    No joint pain; call if he develops any  Related  Medications tacrolimus (PROTOPIC) 0.1 % ointment Apply to aa's rash QHS.  Tapinarof (VTAMA) 1 % CREA Apply 1 application topically in the morning.  Neoplasm of uncertain behavior of skin left posterior neck  Epidermal / dermal shaving  Lesion diameter (cm):  0.6 Informed consent: discussed and consent obtained   Timeout: patient name, date of birth, surgical site, and procedure verified   Patient was prepped and draped in usual sterile fashion: area prepped with isopropyl alcohol. Anesthesia: the lesion was anesthetized in a standard fashion   Anesthetic:  1% lidocaine w/ epinephrine 1-100,000 buffered w/ 8.4% NaHCO3 Instrument used: flexible razor blade   Hemostasis achieved with: aluminum chloride   Outcome: patient tolerated procedure well   Post-procedure details: wound care instructions given   Additional details:  Mupirocin and a bandage applied  Specimen 1 - Surgical pathology Differential Diagnosis: R/o Irritated Nevus vs other  Check Margins: No 0.6 cm erythematous papule   Other eczema Neck - Posterior  Chronic condition with duration or expected duration over one year. Currently well-controlled.  Continue Dupixent q2 weeks. No conjunctivitis, injection site reactions or cold sores.  Dupilumab (Dupixent) is a treatment given by injection for adults and children with moderate-to-severe atopic dermatitis. Goal is control of skin condition, not cure. It is given as 2 injections at the first dose followed by 1 injection ever 2 weeks thereafter.  Young children are dosed monthly.  Potential side effects include allergic reaction, herpes infections, injection site reactions and conjunctivitis (inflammation of the eyes).  The use of Dupixent requires long term medication management, including periodic office visits.  Discussed  Adbry as it is approved for once a month use once well-controlled if that is enough to keep eczema under control. However, it is 2 injections  rather than one.  Return in about 1 year (around 04/24/2022) for Psoriasis, Dermatitis.  Graciella Belton, RMA, am acting as scribe for Forest Gleason, MD .  Documentation: I have reviewed the above documentation for accuracy and completeness, and I agree with the above.  Forest Gleason, MD

## 2021-04-24 NOTE — Patient Instructions (Addendum)
Wound Care Instructions  Cleanse wound gently with soap and water once a day then pat dry with clean gauze. Apply a thing coat of Petrolatum (petroleum jelly, "Vaseline") over the wound (unless you have an allergy to this). We recommend that you use a new, sterile tube of Vaseline. Do not pick or remove scabs. Do not remove the yellow or white "healing tissue" from the base of the wound.  Cover the wound with fresh, clean, nonstick gauze and secure with paper tape. You may use Band-Aids in place of gauze and tape if the would is small enough, but would recommend trimming much of the tape off as there is often too much. Sometimes Band-Aids can irritate the skin.  You should call the office for your biopsy report after 1 week if you have not already been contacted.  If you experience any problems, such as abnormal amounts of bleeding, swelling, significant bruising, significant pain, or evidence of infection, please call the office immediately.  FOR ADULT SURGERY PATIENTS: If you need something for pain relief you may take 1 extra strength Tylenol (acetaminophen) AND 2 Ibuprofen (200mg  each) together every 4 hours as needed for pain. (do not take these if you are allergic to them or if you have a reason you should not take them.) Typically, you may only need pain medication for 1 to 3 days.   For psoriais - Continue Vtama 1-2 times daily as needed. Continue desoximetasone 1-2 times weekends only. Avoid applying to face, groin, and axilla. Use as directed. Long-term use can cause thinning of the skin. Continue tacrolimus 1-2 times daily as needed.  Topical steroids (such as triamcinolone, fluocinolone, fluocinonide, mometasone, clobetasol, halobetasol, betamethasone, hydrocortisone) can cause thinning and lightening of the skin if they are used for too long in the same area. Your physician has selected the right strength medicine for your problem and area affected on the body. Please use your  medication only as directed by your physician to prevent side effects.   Dupilumab (Dupixent) is a treatment given by injection for adults and children with moderate-to-severe atopic dermatitis. Goal is control of skin condition, not cure. It is given as 2 injections at the first dose followed by 1 injection ever 2 weeks thereafter.  Young children are dosed monthly.  Potential side effects include allergic reaction, herpes infections, injection site reactions and conjunctivitis (inflammation of the eyes).  The use of Dupixent requires long term medication management, including periodic office visits.  If you have any questions or concerns for your doctor, please call our main line at [phone] and press option 4 to reach your doctor's medical assistant. If no one answers, please leave a voicemail as directed and we will return your call as soon as possible. Messages left after 4 pm will be answered the following business day.   You may also send [place] a message via [place]. We typically respond to MyChart messages within 1-2 business days.  For prescription refills, please ask your pharmacy to contact our office. Our fax number is [phone].  If you have an urgent issue when the clinic is closed that cannot wait until the next business day, you can page your doctor at the number below.    Please note that while we do our best to be available for urgent issues outside of office hours, we are not available 24/7.   If you have an urgent issue and are unable to reach [place], you may choose to seek medical care at your doctor's  office, retail clinic, urgent care center, or emergency room.  If you have a medical emergency, please immediately call 911 or go to the emergency department.  Pager Numbers  - Dr. Nehemiah Massed: [phone]  - Dr. Laurence Ferrari: [phone]  - Dr. Nicole Kindred: [phone]  In the event of inclement weather, please call our main line at [phone] for an update on the status of any  delays or closures.  Dermatology Medication Tips: Please keep the boxes that topical medications come in in order to help keep track of the instructions about where and how to use these. Pharmacies typically print the medication instructions only on the boxes and not directly on the medication tubes.   If your medication is too expensive, please contact our office at [phone] option 4 or send [place] a message through [place].   We are unable to tell what your co-pay for medications will be in advance as this is different depending on your insurance coverage. However, we may be able to find a substitute medication at lower cost or fill out paperwork to get insurance to cover a needed medication.   If a prior authorization is required to get your medication covered by your insurance company, please allow [place] 1-2 business days to complete this process.  Drug prices often vary depending on where the prescription is filled and some pharmacies may offer cheaper prices.  The website www.goodrx.com contains coupons for medications through different pharmacies. The prices here do not account for what the cost may be with help from insurance (it may be cheaper with your insurance), but the website can give you the price if you did not use any insurance.  - You can print the associated coupon and take it with your prescription to the pharmacy.  - You may also stop by our office during regular business hours and pick up a GoodRx coupon card.  - If you need your prescription sent electronically to a different pharmacy, notify our office through [facility] or by phone at [phone] option 4.

## 2021-04-26 ENCOUNTER — Telehealth: Payer: Self-pay

## 2021-04-26 NOTE — Telephone Encounter (Signed)
-----   Message from Alfonso Patten, MD sent at 04/26/2021  8:34 AM EDT ----- Skin , left posterior neck MELANOCYTIC NEVUS, INTRADERMAL TYPE, BASE INVOLVED  This is a NORMAL MOLE. No additional treatment is needed. If you notice any new or changing spots or have other skin concerns in future, please call our office at [phone].     MAs please call. Thank you!

## 2021-04-26 NOTE — Telephone Encounter (Signed)
Patient advised bx results showed benign nevus.

## 2021-05-02 ENCOUNTER — Other Ambulatory Visit: Payer: Self-pay | Admitting: Dermatology

## 2021-05-02 DIAGNOSIS — L2081 Atopic neurodermatitis: Secondary | ICD-10-CM

## 2021-05-22 NOTE — Telephone Encounter (Signed)
Entered in error

## 2021-08-11 ENCOUNTER — Encounter: Payer: Self-pay | Admitting: Emergency Medicine

## 2021-08-11 ENCOUNTER — Ambulatory Visit
Admission: EM | Admit: 2021-08-11 | Discharge: 2021-08-11 | Disposition: A | Discharge status: Home / Self Care | Payer: BC Managed Care – PPO

## 2021-08-11 ENCOUNTER — Other Ambulatory Visit: Payer: Self-pay

## 2021-08-11 DIAGNOSIS — J0140 Acute pansinusitis, unspecified: Secondary | ICD-10-CM | POA: Diagnosis not present

## 2021-08-11 DIAGNOSIS — J209 Acute bronchitis, unspecified: Secondary | ICD-10-CM

## 2021-08-11 MED ORDER — ALBUTEROL SULFATE HFA 108 (90 BASE) MCG/ACT IN AERS
2.0000 | INHALATION_SPRAY | Freq: Once | RESPIRATORY_TRACT | Status: AC
  Administered 2021-08-11: 2 via RESPIRATORY_TRACT

## 2021-08-11 MED ORDER — PROMETHAZINE-DM 6.25-15 MG/5ML PO SYRP: 5.0000 mL | ORAL_SOLUTION | Freq: Four times a day (QID) | ORAL | 0 refills | Status: DC | PRN

## 2021-08-11 MED ORDER — ALBUTEROL SULFATE HFA 108 (90 BASE) MCG/ACT IN AERS: 2.0000 | INHALATION_SPRAY | Freq: Four times a day (QID) | RESPIRATORY_TRACT | 0 refills | Status: DC | PRN

## 2021-08-11 MED ORDER — DOXYCYCLINE HYCLATE 100 MG PO CAPS: 100.0000 mg | ORAL_CAPSULE | Freq: Two times a day (BID) | ORAL | 0 refills | Status: DC

## 2021-08-11 NOTE — ED Triage Notes (Signed)
Pt presents with a dry cough x 6 days.

## 2021-08-11 NOTE — Discharge Instructions (Addendum)
Continue Mucinex.  Take entire course of doxycycline.  Promethazine DM as needed for cough.  You have an allergy to hydrocortisone therefore I cannot prescribe you prednisone.  So start albuterol inhaler 2 puffs every 4-6 hours as needed for any shortness of breath or persistent coughing.

## 2021-08-11 NOTE — ED Provider Notes (Signed)
[facility]    CSN: [id number] Arrival date & time: 08/11/21  1131      History   Chief Complaint Chief Complaint  Patient presents with   Cough    HPI Dylan Oliver is a 46 y.o. male.   HPI Patient presents today with persistent cough following a recent upper URI. Patient reports experiencing initial sinus congestion and drainage with subsequent development of persistent cough which is non productive but persistent. No fever. He has a history of skin allergies bit no history of asthma. He has been taking Mucinex without relief of cough. Denies SOB. Past Medical History:  Diagnosis Date   Gout    Hx of migraines    Hypertriglyceridemia    Obesity    PVC's (premature ventricular contractions)     Patient Active Problem List   Diagnosis Date Noted   Lymphadenopathy of head and neck 04/07/2014   Skin rash 04/07/2014   Elevated BP 04/07/2014   Chest pain 06/25/2013   Palpitations 04/30/2012   Gout 02/17/2012   Dyslipidemia 02/17/2012   GERD 09/08/2008   Hypertriglyceridemia 08/08/2001    Past Surgical History:  Procedure Laterality Date   APPENDECTOMY  1993   BACK SURGERY     what sounds like pilonidal cyst extraction   CARDIOVASCULAR STRESS TEST  2003   poor exercise tolerance, no ischemia       Home Medications    Prior to Admission medications   Medication Sig Start Date End Date Taking? Authorizing Provider  albuterol (VENTOLIN HFA) 108 (90 Base) MCG/ACT inhaler Inhale 2 puffs into the lungs every 6 (six) hours as needed for wheezing or shortness of breath. 08/11/21  Yes Scot Jun, FNP  doxycycline (VIBRAMYCIN) 100 MG capsule Take 1 capsule (100 mg total) by mouth 2 (two) times daily. 08/11/21  Yes Scot Jun, FNP  promethazine-dextromethorphan (PROMETHAZINE-DM) 6.25-15 MG/5ML syrup Take 5 mLs by mouth 4 (four) times daily as needed for cough. 08/11/21  Yes Scot Jun, FNP  clotrimazole (LOTRIMIN) 1 % cream Apply 1 application  topically 2 (two) times daily. 04/07/14   Ria Bush, MD  colchicine 0.6 MG tablet Take 2 initially, then may take one pill twice daily as needed for gout attack. 05/11/12   Ria Bush, MD  Crisaborole (EUCRISA) 2 % OINT Apply 1 application topically daily. 09/14/20   Moye, [place], MD  desoximetasone (TOPICORT) 0.25 % cream Apply 1 application topically daily. Daily as needed, weekends only. Avoid applying to face, groin, and axilla. Use as directed. Risk of skin atrophy with long-term use reviewed. 03/20/21   Moye, [place], MD  DUPIXENT 300 MG/2ML SOPN INJECT 1 PEN UNDER THE SKIN EVERY OTHER WEEK 05/02/21   Moye, [place], MD  erythromycin with ethanol (EMGEL) 2 % gel Apply topically 2 (two) times daily. Apply to feet until clear 08/02/20   [facility], [place], MD  hydrOXYzine (ATARAX/VISTARIL) 25 MG tablet Take 1-2 at bedtime as needed for itch. 05/24/20   Moye, [place], MD  ibuprofen (ADVIL,MOTRIN) 600 MG tablet Take by mouth every 6 (six) hours as needed.    [provider]  meloxicam (MOBIC) 15 MG tablet Take 15 mg by mouth daily. 07/20/21   [provider]  tacrolimus (PROTOPIC) 0.1 % ointment Apply to aa's eczema BID PRN. 03/29/20   Ralene Bathe, MD  tacrolimus (PROTOPIC) 0.1 % ointment Apply to aa's rash QHS. 02/22/21   Moye, [place], MD  Tapinarof (VTAMA) 1 % CREA Apply 1 application topically in  the morning. 02/22/21   Moye, [place], MD  triamcinolone cream (KENALOG) 0.1 % APPLY TO AFFECTED AREA TWICE A DAY UP TO 5 DAYS PER WEEK ON ITCHY RASH ON FEET, BUTTOCKS, AND TRUNK UNTIL CLEAR 01/27/20   Ralene Bathe, MD    Family History Family History  Problem Relation Age of Onset   Hypertension Father    Hypertension Mother    Diabetes Father    Coronary artery disease Maternal Grandfather 24   Stroke Neg Hx    Cancer Mother        skin    Social History Social History   Tobacco Use   Smoking status: Former    Years: 4.00    Types: Cigarettes     Quit date: 07/16/1997    Years since quitting: 24.0   Smokeless tobacco: Former    Types: Snuff  Vaping Use   Vaping Use: Never used  Substance Use Topics   Alcohol use: Yes    Alcohol/week: 0.0 standard drinks    Comment: ocassionally   Drug use: No     Allergies   Hydrocortisone butyrate, Balsam, Benzyl alcohol, and Nickel   Review of Systems Review of Systems Pertinent negatives listed in HPI   Physical Exam Triage Vital Signs ED Triage Vitals  Enc Vitals Group     BP 08/11/21 1153 122/78     Pulse Rate 08/11/21 1153 84     Resp 08/11/21 1153 18     Temp 08/11/21 1153 98.6 F (37 C)     Temp Source 08/11/21 1153 Oral     SpO2 08/11/21 1153 96 %     Weight --      Height --      Head Circumference --      Peak Flow --      Pain Score 08/11/21 1151 0     Pain Loc --      Pain Edu? --      Excl. in [place]? --    No data found.  Updated Vital Signs BP 122/78 (BP Location: Left Arm)    Pulse 84    Temp 98.6 F (37 C) (Oral)    Resp 18    SpO2 96%   Visual Acuity Right Eye Distance:   Left Eye Distance:   Bilateral Distance:    Right Eye Near:   Left Eye Near:    Bilateral Near:     Physical Exam Constitutional:      Appearance: Normal appearance.  HENT:     Head: Normocephalic and atraumatic.     Right Ear: External ear normal.     Left Ear: External ear normal.     Nose: Congestion and rhinorrhea present.  Eyes:     Extraocular Movements: Extraocular movements intact.     Pupils: Pupils are equal, round, and reactive to light.  Cardiovascular:     Rate and Rhythm: Normal rate and regular rhythm.  Pulmonary:     Effort: Pulmonary effort is normal.     Breath sounds: Rhonchi present.  Skin:    General: Skin is warm and dry.     Capillary Refill: Capillary refill takes less than 2 seconds.  Neurological:     General: No focal deficit present.     Mental Status: He is alert.  Psychiatric:        Mood and Affect: Mood normal.        Behavior:  Behavior normal.        Thought Content:  Thought content normal.        Judgment: Judgment normal.     UC Treatments / Results  Labs (all labs ordered are listed, but only abnormal results are displayed) Labs Reviewed - No data to display  EKG   Radiology No results found.  Procedures Procedures (including critical care time)  Medications Ordered in UC Medications  albuterol (VENTOLIN HFA) 108 (90 Base) MCG/ACT inhaler 2 puff (2 puffs Inhalation Given 08/11/21 1230)    Initial Impression / Assessment and Plan / UC Course  I have reviewed the triage vital signs and the nursing notes.  Pertinent labs & imaging results that were available during my care of the patient were reviewed by me and considered in my medical decision making (see chart for details).    Acute bronchitis with acute sinusitis treatment today with doxycycline along with Promethazine DM for management of cough.  Patient has a allergy to hydrocortisone therefore unable to prescribe prednisone.  Prescribed albuterol inhaler 2 puffs every 4-6 hours as needed for shortness of breath, chest tightness or cyclic type coughing.  Patient will return if symptoms worsen or do not readily improve. Final Clinical Impressions(s) / UC Diagnoses   Final diagnoses:  Acute bronchitis, unspecified organism  Acute non-recurrent pansinusitis     Discharge Instructions      Continue Mucinex.  Take entire course of doxycycline.  Promethazine DM as needed for cough.  You have an allergy to hydrocortisone therefore I cannot prescribe you prednisone.  So start albuterol inhaler 2 puffs every 4-6 hours as needed for any shortness of breath or persistent coughing.     ED Prescriptions     Medication Sig Dispense Auth. Provider   doxycycline (VIBRAMYCIN) 100 MG capsule Take 1 capsule (100 mg total) by mouth 2 (two) times daily. 20 capsule Scot Jun, FNP   promethazine-dextromethorphan (PROMETHAZINE-DM) 6.25-15 MG/5ML  syrup Take 5 mLs by mouth 4 (four) times daily as needed for cough. 140 mL Scot Jun, FNP   albuterol (VENTOLIN HFA) 108 (90 Base) MCG/ACT inhaler Inhale 2 puffs into the lungs every 6 (six) hours as needed for wheezing or shortness of breath. 1 each Scot Jun, FNP      PDMP not reviewed this encounter.   Scot Jun, FNP 08/11/21 1235

## 2021-11-14 ENCOUNTER — Other Ambulatory Visit: Payer: Self-pay | Admitting: Dermatology

## 2021-11-14 DIAGNOSIS — L2081 Atopic neurodermatitis: Secondary | ICD-10-CM

## 2021-12-03 ENCOUNTER — Encounter: Payer: Self-pay | Admitting: Emergency Medicine

## 2021-12-03 ENCOUNTER — Ambulatory Visit
Admission: EM | Admit: 2021-12-03 | Discharge: 2021-12-03 | Disposition: A | Discharge status: Home / Self Care | Payer: BC Managed Care – PPO | Attending: Emergency Medicine | Admitting: Emergency Medicine

## 2021-12-03 DIAGNOSIS — R1031 Right lower quadrant pain: Secondary | ICD-10-CM

## 2021-12-03 DIAGNOSIS — R197 Diarrhea, unspecified: Secondary | ICD-10-CM

## 2021-12-03 LAB — POCT URINALYSIS DIP (MANUAL ENTRY)
Bilirubin, UA: NEGATIVE
Blood, UA: NEGATIVE
Glucose, UA: NEGATIVE mg/dL
Ketones, POC UA: NEGATIVE mg/dL
Leukocytes, UA: NEGATIVE
Nitrite, UA: NEGATIVE
Protein Ur, POC: NEGATIVE mg/dL
Spec Grav, UA: 1.015 (ref 1.010–1.025)
Urobilinogen, UA: 0.2 E.U./dL
pH, UA: 6 (ref 5.0–8.0)

## 2021-12-03 NOTE — Discharge Instructions (Addendum)
Keep yourself hydrated with clear liquids, such as water and Gatorade.  Follow the attached diarrhea diet.    Go to the emergency department if you have acute worsening symptoms.    Follow up with your primary care provider.

## 2021-12-03 NOTE — ED Provider Notes (Signed)
Roderic Palau    CSN: [id number] Arrival date & time: 12/03/21  1834      History   Chief Complaint Chief Complaint  Patient presents with   Abdominal Pain    RLQ    HPI EUAL LINDSTROM is a 46 y.o. male.  Patient presents with RLQ abdominal pain since 0400 this morning.  The pain comes and goes; lasts only a few minutes; none currently.  It improves with bowel movements; He has had 4 bowel movements today with the last 2 being liquid diarrhea this afternoon.  No nausea or vomiting.   He denies fever, chills, dysuria, hematuria, blood in stool, or other symptoms.  His medical history includes appendectomy, GERD, obesity, migraines, dyslipidemia, gout.   The history is provided by the patient and medical records.   Past Medical History:  Diagnosis Date   Gout    Hx of migraines    Hypertriglyceridemia    Obesity    PVC's (premature ventricular contractions)     Patient Active Problem List   Diagnosis Date Noted   Lymphadenopathy of head and neck 04/07/2014   Skin rash 04/07/2014   Elevated BP 04/07/2014   Chest pain 06/25/2013   Palpitations 04/30/2012   Gout 02/17/2012   Dyslipidemia 02/17/2012   GERD 09/08/2008   Hypertriglyceridemia 08/08/2001    Past Surgical History:  Procedure Laterality Date   APPENDECTOMY  1993   BACK SURGERY     what sounds like pilonidal cyst extraction   CARDIOVASCULAR STRESS TEST  2003   poor exercise tolerance, no ischemia       Home Medications    Prior to Admission medications   Medication Sig Start Date End Date Taking? Authorizing Provider  albuterol (VENTOLIN HFA) 108 (90 Base) MCG/ACT inhaler Inhale 2 puffs into the lungs every 6 (six) hours as needed for wheezing or shortness of breath. 08/11/21   Scot Jun, FNP  clotrimazole (LOTRIMIN) 1 % cream Apply 1 application topically 2 (two) times daily. 04/07/14   Ria Bush, MD  colchicine 0.6 MG tablet Take 2 initially, then may take one pill twice daily  as needed for gout attack. 05/11/12   Ria Bush, MD  Crisaborole (EUCRISA) 2 % OINT Apply 1 application topically daily. 09/14/20   Moye, [place], MD  desoximetasone (TOPICORT) 0.25 % cream Apply 1 application topically daily. Daily as needed, weekends only. Avoid applying to face, groin, and axilla. Use as directed. Risk of skin atrophy with long-term use reviewed. 03/20/21   Moye, [place], MD  doxycycline (VIBRAMYCIN) 100 MG capsule Take 1 capsule (100 mg total) by mouth 2 (two) times daily. 08/11/21   Scot Jun, FNP  DUPIXENT 300 MG/2ML SOPN INJECT 1 PEN UNDER THE SKIN EVERY OTHER WEEK 11/14/21   Moye, [place], MD  erythromycin with ethanol (EMGEL) 2 % gel Apply topically 2 (two) times daily. Apply to feet until clear 08/02/20   [facility], [place], MD  hydrOXYzine (ATARAX/VISTARIL) 25 MG tablet Take 1-2 at bedtime as needed for itch. 05/24/20   Moye, [place], MD  ibuprofen (ADVIL,MOTRIN) 600 MG tablet Take by mouth every 6 (six) hours as needed.    [provider]  meloxicam (MOBIC) 15 MG tablet Take 15 mg by mouth daily. 07/20/21   [provider]  promethazine-dextromethorphan (PROMETHAZINE-DM) 6.25-15 MG/5ML syrup Take 5 mLs by mouth 4 (four) times daily as needed for cough. 08/11/21   Scot Jun, FNP  tacrolimus (PROTOPIC) 0.1 % ointment Apply to aa's eczema BID  PRN. 03/29/20   Ralene Bathe, MD  tacrolimus (PROTOPIC) 0.1 % ointment Apply to aa's rash QHS. 02/22/21   Moye, [place], MD  Tapinarof (VTAMA) 1 % CREA Apply 1 application topically in the morning. 02/22/21   Moye, [place], MD  triamcinolone cream (KENALOG) 0.1 % APPLY TO AFFECTED AREA TWICE A DAY UP TO 5 DAYS PER WEEK ON ITCHY RASH ON FEET, BUTTOCKS, AND TRUNK UNTIL CLEAR 01/27/20   Ralene Bathe, MD    Family History Family History  Problem Relation Age of Onset   Hypertension Father    Hypertension Mother    Diabetes Father    Coronary artery disease Maternal Grandfather 63   Stroke Neg  Hx    Cancer Mother        skin    Social History Social History   Tobacco Use   Smoking status: Former    Years: 4.00    Types: Cigarettes    Quit date: 07/16/1997    Years since quitting: 24.4   Smokeless tobacco: Former    Types: Snuff  Vaping Use   Vaping Use: Never used  Substance Use Topics   Alcohol use: Yes    Alcohol/week: 0.0 standard drinks    Comment: ocassionally   Drug use: No     Allergies   Hydrocortisone butyrate, Balsam, Benzyl alcohol, and Nickel   Review of Systems Review of Systems  Constitutional:  Negative for chills and fever.  Gastrointestinal:  Positive for abdominal pain and diarrhea. Negative for blood in stool, nausea and vomiting.  Genitourinary:  Negative for dysuria and hematuria.  Skin:  Negative for color change and rash.  All other systems reviewed and are negative.   Physical Exam Triage Vital Signs ED Triage Vitals  Enc Vitals Group     BP      Pulse      Resp      Temp      Temp src      SpO2      Weight      Height      Head Circumference      Peak Flow      Pain Score      Pain Loc      Pain Edu?      Excl. in [place]?    No data found.  Updated Vital Signs BP 132/66   Pulse 82   Temp 97.9 F (36.6 C)   Resp 18   SpO2 96%   Visual Acuity Right Eye Distance:   Left Eye Distance:   Bilateral Distance:    Right Eye Near:   Left Eye Near:    Bilateral Near:     Physical Exam Vitals and nursing note reviewed.  Constitutional:      General: He is not in acute distress.    Appearance: He is well-developed. He is obese. He is not ill-appearing.  HENT:     Mouth/Throat:     Mouth: Mucous membranes are moist.  Cardiovascular:     Rate and Rhythm: Normal rate and regular rhythm.     Heart sounds: Normal heart sounds.  Pulmonary:     Effort: Pulmonary effort is normal. No respiratory distress.     Breath sounds: Normal breath sounds.  Abdominal:     General: Bowel sounds are normal.     Palpations:  Abdomen is soft.     Tenderness: There is no abdominal tenderness. There is no guarding or rebound.  Musculoskeletal:  Cervical back: Neck supple.  Skin:    General: Skin is warm and dry.  Neurological:     General: No focal deficit present.     Mental Status: He is alert and oriented to person, place, and time.     Gait: Gait normal.  Psychiatric:        Mood and Affect: Mood normal.        Behavior: Behavior normal.     UC Treatments / Results  Labs (all labs ordered are listed, but only abnormal results are displayed) Labs Reviewed  POCT URINALYSIS DIP (MANUAL ENTRY)    EKG   Radiology No results found.  Procedures Procedures (including critical care time)  Medications Ordered in UC Medications - No data to display  Initial Impression / Assessment and Plan / UC Course  I have reviewed the triage vital signs and the nursing notes.  Pertinent labs & imaging results that were available during my care of the patient were reviewed by me and considered in my medical decision making (see chart for details).   RLQ abdominal pain, Diarrhea.  Patient declines transfer to the ED.  He had a couple of episodes of abdominal pain while here at [facility]; these lasted a few seconds and resolved spontaneously.  He is afebrile and VSS.  Abdomen is soft and nontender to palpation; good bowel sounds.  Discussed hydration with clear liquids and following the diarrhea diet.  ED precautions discussed.  Education provided on abdominal pain and diarrhea.  Patient agrees to plan of care.     Final Clinical Impressions(s) / UC Diagnoses   Final diagnoses:  Right lower quadrant abdominal pain  Diarrhea, unspecified type     Discharge Instructions      Keep yourself hydrated with clear liquids, such as water and Gatorade.  Follow the attached diarrhea diet.    Go to the emergency department if you have acute worsening symptoms.    Follow up with your primary care provider.           ED Prescriptions   None    PDMP not reviewed this encounter.   Sharion Balloon, NP 12/03/21 1914

## 2021-12-03 NOTE — ED Triage Notes (Signed)
Pt reports intermittent RLQ abdominal pain since 4 am this morning. States the pain does not move and comes and goes in waves. Denies taking OTC medication.

## 2021-12-11 ENCOUNTER — Other Ambulatory Visit: Payer: Self-pay | Admitting: Surgery

## 2021-12-11 DIAGNOSIS — R1031 Right lower quadrant pain: Secondary | ICD-10-CM

## 2021-12-11 DIAGNOSIS — R197 Diarrhea, unspecified: Secondary | ICD-10-CM

## 2021-12-12 ENCOUNTER — Ambulatory Visit
Admission: RE | Admit: 2021-12-12 | Discharge: 2021-12-12 | Disposition: A | Discharge status: Home / Self Care | Payer: BC Managed Care – PPO | Source: Ambulatory Visit | Attending: Surgery | Admitting: Surgery

## 2021-12-12 DIAGNOSIS — R197 Diarrhea, unspecified: Secondary | ICD-10-CM

## 2021-12-12 DIAGNOSIS — R1031 Right lower quadrant pain: Secondary | ICD-10-CM

## 2021-12-12 MED ORDER — IOPAMIDOL (ISOVUE-300) INJECTION 61%
100.0000 mL | Freq: Once | INTRAVENOUS | Status: AC | PRN
  Administered 2021-12-12: 100 mL via INTRAVENOUS

## 2022-02-11 ENCOUNTER — Other Ambulatory Visit: Payer: Self-pay | Admitting: Dermatology

## 2022-02-11 DIAGNOSIS — L409 Psoriasis, unspecified: Secondary | ICD-10-CM

## 2022-02-25 ENCOUNTER — Other Ambulatory Visit: Payer: Self-pay

## 2022-02-25 ENCOUNTER — Emergency Department
Admission: EM | Admit: 2022-02-25 | Discharge: 2022-02-25 | Disposition: A | Discharge status: Home / Self Care | Payer: BC Managed Care – PPO | Attending: Emergency Medicine | Admitting: Emergency Medicine

## 2022-02-25 ENCOUNTER — Encounter: Payer: Self-pay | Admitting: Intensive Care

## 2022-02-25 ENCOUNTER — Emergency Department: Payer: BC Managed Care – PPO

## 2022-02-25 DIAGNOSIS — N132 Hydronephrosis with renal and ureteral calculous obstruction: Secondary | ICD-10-CM | POA: Insufficient documentation

## 2022-02-25 DIAGNOSIS — R109 Unspecified abdominal pain: Secondary | ICD-10-CM | POA: Diagnosis present

## 2022-02-25 DIAGNOSIS — N200 Calculus of kidney: Secondary | ICD-10-CM

## 2022-02-25 LAB — CBC
HCT: 45.1 % (ref 39.0–52.0)
Hemoglobin: 15.2 g/dL (ref 13.0–17.0)
MCH: 29.9 pg (ref 26.0–34.0)
MCHC: 33.7 g/dL (ref 30.0–36.0)
MCV: 88.6 fL (ref 80.0–100.0)
Platelets: 236 10*3/uL (ref 150–400)
RBC: 5.09 MIL/uL (ref 4.22–5.81)
RDW: 12.6 % (ref 11.5–15.5)
WBC: 12.1 10*3/uL — ABNORMAL HIGH (ref 4.0–10.5)
nRBC: 0 % (ref 0.0–0.2)

## 2022-02-25 LAB — URINALYSIS, ROUTINE W REFLEX MICROSCOPIC
Bacteria, UA: NONE SEEN
Bilirubin Urine: NEGATIVE
Glucose, UA: NEGATIVE mg/dL
Ketones, ur: NEGATIVE mg/dL
Leukocytes,Ua: NEGATIVE
Nitrite: NEGATIVE
Protein, ur: NEGATIVE mg/dL
RBC / HPF: >50 RBC/hpf — ABNORMAL HIGH (ref 0–5)
Specific Gravity, Urine: 1.023 (ref 1.005–1.030)
Squamous Epithelial / HPF: NONE SEEN (ref 0–5)
pH: 5 (ref 5.0–8.0)

## 2022-02-25 LAB — BASIC METABOLIC PANEL
Anion gap: 6 (ref 5–15)
BUN: 16 mg/dL (ref 6–20)
CO2: 25 mmol/L (ref 22–32)
Calcium: 8.7 mg/dL — ABNORMAL LOW (ref 8.9–10.3)
Chloride: 108 mmol/L (ref 98–111)
Creatinine, Ser: 1.3 mg/dL — ABNORMAL HIGH (ref 0.61–1.24)
GFR, Estimated: >60 mL/min (ref >60)
Glucose, Bld: 129 mg/dL — ABNORMAL HIGH (ref 70–99)
Potassium: 4.4 mmol/L (ref 3.5–5.1)
Sodium: 139 mmol/L (ref 135–145)

## 2022-02-25 MED ORDER — MORPHINE SULFATE (PF) 4 MG/ML IV SOLN
4.0000 mg | Freq: Once | INTRAVENOUS | Status: AC
  Administered 2022-02-25: 4 mg via INTRAVENOUS
  Filled 2022-02-25: qty 1

## 2022-02-25 MED ORDER — OXYCODONE-ACETAMINOPHEN 5-325 MG PO TABS: 1.0000 | ORAL_TABLET | ORAL | 0 refills | Status: DC | PRN

## 2022-02-25 MED ORDER — SODIUM CHLORIDE 0.9 % IV BOLUS
1000.0000 mL | Freq: Once | INTRAVENOUS | Status: AC
  Administered 2022-02-25: 1000 mL via INTRAVENOUS

## 2022-02-25 MED ORDER — ONDANSETRON 4 MG PO TBDP: 4.0000 mg | ORAL_TABLET | Freq: Three times a day (TID) | ORAL | 0 refills | Status: AC | PRN

## 2022-02-25 NOTE — ED Notes (Signed)
First Nurse Note: Patient sent from [facility] for left sided flank pain. Patient has incidental finding of a 48m stone in June. Patient had pain starting last night on left flank. Patient given '25mg'$  phenergan, '60mg'$  Toradol, and 1g Tylenol. Patient pacing in wating room at this time.

## 2022-02-25 NOTE — ED Provider Notes (Signed)
[facility] Provider Note    Event Date/Time   First MD Initiated Contact with Patient 02/25/22 1101     (approximate)   History   Chief Complaint Flank Pain   HPI  Dylan Oliver is a 46 y.o. male with past medical history of hyperlipidemia and gout who presents to the ED complaining of flank pain.  Patient reports that yesterday morning around 2 AM he was woken from sleep with pain in his left flank.  Pain has been intermittent since then, but more severe this morning.  It radiates across his side and is described as sharp, not exacerbated or alleviated by anything.  He has been feeling nauseous and has vomited a couple of times, denies any changes in his bowel movements.  He has not noticed any dysuria or hematuria, denies any fevers.  He was sent over from walk-in clinic due to concern for possible kidney stone, was given IM Toradol and Phenergan at that time with partial improvement in pain.  He denies any history of kidney stones.     Physical Exam   Triage Vital Signs: ED Triage Vitals  Enc Vitals Group     BP 02/25/22 1000 122/80     Pulse Rate 02/25/22 1000 65     Resp 02/25/22 1000 16     Temp 02/25/22 1000 98.3 F (36.8 C)     Temp Source 02/25/22 1000 Oral     SpO2 02/25/22 1000 99 %     Weight 02/25/22 1001 238 lb (108 kg)     Height 02/25/22 1001 '5\' 8"'$  (1.727 m)     Head Circumference --      Peak Flow --      Pain Score 02/25/22 1001 4     Pain Loc --      Pain Edu? --      Excl. in [employer]? --     Most recent vital signs: Vitals:   02/25/22 1000  BP: 122/80  Pulse: 65  Resp: 16  Temp: 98.3 F (36.8 C)  SpO2: 99%    Constitutional: Alert and oriented. Eyes: Conjunctivae are normal. Head: Atraumatic. Nose: No congestion/rhinnorhea. Mouth/Throat: Mucous membranes are moist.  Cardiovascular: Normal rate, regular rhythm. Grossly normal heart sounds.  2+ radial pulses bilaterally. Respiratory: Normal respiratory effort.  No  retractions. Lungs CTAB. Gastrointestinal: Soft and nontender.  No CVA tenderness bilaterally.  No distention. Musculoskeletal: No lower extremity tenderness nor edema.  Neurologic:  Normal speech and language. No gross focal neurologic deficits are appreciated.    ED Results / Procedures / Treatments   Labs (all labs ordered are listed, but only abnormal results are displayed) Labs Reviewed  URINALYSIS, ROUTINE W REFLEX MICROSCOPIC - Abnormal; Notable for the following components:      Result Value   Color, Urine YELLOW (*)    APPearance HAZY (*)    Hgb urine dipstick LARGE (*)    RBC / HPF >50 (*)    All other components within normal limits  BASIC METABOLIC PANEL - Abnormal; Notable for the following components:   Glucose, Bld 129 (*)    Creatinine, Ser 1.30 (*)    Calcium 8.7 (*)    All other components within normal limits  CBC - Abnormal; Notable for the following components:   WBC 12.1 (*)    All other components within normal limits   RADIOLOGY CT renal protocol reviewed and interpreted by me with 2 to 3 mm stone at the left  mid ureter with associated hydronephrosis.  PROCEDURES:  Critical Care performed: No  Procedures   MEDICATIONS ORDERED IN ED: Medications  morphine (PF) 4 MG/ML injection 4 mg (4 mg Intravenous Given 02/25/22 1130)  sodium chloride 0.9 % bolus 1,000 mL (1,000 mLs Intravenous New Bag/Given 02/25/22 1129)     IMPRESSION / MDM / ASSESSMENT AND PLAN / ED COURSE  I reviewed the triage vital signs and the nursing notes.                              46 y.o. male with past medical history of hyperlipidemia and gout who presents to the ED with intermittent and now severe left flank pain starting yesterday morning around 2 AM.  Patient's presentation is most consistent with acute presentation with potential threat to life or bodily function.  Differential diagnosis includes, but is not limited to, kidney stone, pyelonephritis, diverticulitis,  gastritis.  Patient nontoxic-appearing and in no acute distress, vital signs are unremarkable.  Pain is not reproducible with palpation of his abdomen or costovertebral area, does seem currently severe with patient pacing about the room.  He had partial relief with Toradol earlier, states nausea has resolved.  We will treat with IV morphine, hydrate with IV fluids, and further assess with CT renal protocol.  Labs are reassuring with no significant anemia, leukocytosis, electrolyte abnormality, or AKI.  Urinalysis shows RBCs consistent with ureterolithiasis, no findings to suggest infection.  CT scan shows kidney stone at the left mid ureter with associated hydronephrosis.  Pain is much improved following dose of IV morphine and patient is appropriate for outpatient management with urology follow-up as needed.  He was prescribed Percocet and Zofran, was counseled to return to the ED for new or worsening symptoms.  Patient agrees with plan.      FINAL CLINICAL IMPRESSION(S) / ED DIAGNOSES   Final diagnoses:  Left flank pain  Kidney stone     Rx / DC Orders   ED Discharge Orders          Ordered    oxyCODONE-acetaminophen (PERCOCET) 5-325 MG tablet  Every 4 hours PRN        02/25/22 1206    ondansetron (ZOFRAN-ODT) 4 MG disintegrating tablet  Every 8 hours PRN        02/25/22 1206             Note:  This document was prepared using Dragon voice recognition software and may include unintentional dictation errors.   Blake Divine, MD 02/25/22 [phone]

## 2022-02-25 NOTE — ED Triage Notes (Addendum)
C/o left flank pain that started yesterday. Brought over by [facility] for possible kidney stone. Patient given Toradol and phenergan IM at [facility]. Denies trouble urinating.

## 2022-03-05 ENCOUNTER — Encounter: Payer: Self-pay | Admitting: Urology

## 2022-03-05 ENCOUNTER — Ambulatory Visit: Payer: BC Managed Care – PPO | Admitting: Urology

## 2022-03-05 ENCOUNTER — Ambulatory Visit
Admission: RE | Admit: 2022-03-05 | Discharge: 2022-03-05 | Disposition: A | Discharge status: Home / Self Care | Payer: BC Managed Care – PPO | Source: Ambulatory Visit | Attending: Urology | Admitting: Urology

## 2022-03-05 VITALS — BP 119/82 | HR 92 | Ht 68.0 in | Wt 231.6 lb

## 2022-03-05 DIAGNOSIS — N200 Calculus of kidney: Secondary | ICD-10-CM | POA: Insufficient documentation

## 2022-03-05 DIAGNOSIS — N201 Calculus of ureter: Secondary | ICD-10-CM | POA: Diagnosis not present

## 2022-03-05 DIAGNOSIS — R350 Frequency of micturition: Secondary | ICD-10-CM | POA: Diagnosis not present

## 2022-03-06 ENCOUNTER — Telehealth: Payer: Self-pay | Admitting: *Deleted

## 2022-03-06 LAB — URINALYSIS, COMPLETE
Bilirubin, UA: NEGATIVE
Glucose, UA: NEGATIVE
Ketones, UA: NEGATIVE
Leukocytes,UA: NEGATIVE
Nitrite, UA: NEGATIVE
Protein,UA: NEGATIVE
RBC, UA: NEGATIVE
Specific Gravity, UA: 1.025 (ref 1.005–1.030)
Urobilinogen, Ur: 0.2 mg/dL (ref 0.2–1.0)
pH, UA: 5 (ref 5.0–7.5)

## 2022-03-06 LAB — MICROSCOPIC EXAMINATION

## 2022-03-06 NOTE — Progress Notes (Signed)
03/05/22  Dylan Oliver August 31, 1975 [id number]  Referring provider: Ria Bush, MD [place],   [id number]  Chief Complaint  Patient presents with   Nephrolithiasis    HPI: 46 year old male who presents today for further evaluation of the left ureteral calculus.  About 2 weeks ago, he had some acute pain on Sunday which progressed to severe with associated nausea and vomiting.  He presented to the emergency room on 02/25/2022 at which time a CT stone protocol noted a 3 mm mid left ureteral calculus with mild left-sided hydronephrosis.  No additional upper tract stones were appreciated.  He had a mild leukocytosis of 12.1 and creatinine slightly elevated to 1.3, otherwise labs were unremarkable, his pain was able to be controlled and he was discharged with outpatient follow-up.  He had pain off and on for few more days but his last episode of significant pain was last Friday.  He denies any urinary symptoms including no dysuria or gross hematuria.  No further nausea or vomiting.  He does have some significant urinary urgency and frequency which started a few days ago.  He has been drinking a large amount of water, cranberry juice and lemonade.  No previous history of stones.  He does drink a large amount of [employer] and sweet tea.  He also has a personal history of gout and the stone episode was in and around the time of a gout flare.  He does not really follow with his PCP on a regular basis.   PMH: Past Medical History:  Diagnosis Date   Gout    Hx of migraines    Hypertriglyceridemia    Obesity    PVC's (premature ventricular contractions)     Surgical History: Past Surgical History:  Procedure Laterality Date   APPENDECTOMY  1993   BACK SURGERY     what sounds like pilonidal cyst extraction   CARDIOVASCULAR STRESS TEST  2003   poor exercise tolerance, no ischemia    Home Medications:  Allergies as of 03/05/2022       Reactions    Hydrocortisone Butyrate    Balsam Other (See Comments)   Positive patch test   Benzyl Alcohol    Other reaction(s): Other (See Comments) Positive patch test   Nickel    Other reaction(s): Other (See Comments) Positive patch test   Other Rash   leather        Medication List        Accurate as of March 05, 2022 11:59 PM. If you have any questions, ask your nurse or doctor.          STOP taking these medications    doxycycline 100 MG capsule Commonly known as: VIBRAMYCIN Stopped by: Hollice Espy, MD   oxyCODONE-acetaminophen 5-325 MG tablet Commonly known as: Percocet Stopped by: Hollice Espy, MD   promethazine-dextromethorphan 6.25-15 MG/5ML syrup Commonly known as: PROMETHAZINE-DM Stopped by: Hollice Espy, MD   triamcinolone cream 0.1 % Commonly known as: KENALOG Stopped by: Hollice Espy, MD       TAKE these medications    albuterol 108 (90 Base) MCG/ACT inhaler Commonly known as: VENTOLIN HFA Inhale 2 puffs into the lungs every 6 (six) hours as needed for wheezing or shortness of breath.   clotrimazole 1 % cream Commonly known as: LOTRIMIN Apply 1 application topically 2 (two) times daily.   colchicine 0.6 MG tablet Take 2 initially, then may take one pill twice daily as needed for gout attack.  desoximetasone 0.25 % cream Commonly known as: TOPICORT Apply 1 application topically daily. Daily as needed, weekends only. Avoid applying to face, groin, and axilla. Use as directed. Risk of skin atrophy with long-term use reviewed.   Dupixent 300 MG/2ML Sopn Generic drug: Dupilumab INJECT 1 PEN UNDER THE SKIN EVERY OTHER WEEK   erythromycin with ethanol 2 % gel Commonly known as: EMGEL Apply topically 2 (two) times daily. Apply to feet until clear   Eucrisa 2 % Oint Generic drug: Crisaborole Apply 1 application topically daily.   hydrOXYzine 25 MG tablet Commonly known as: ATARAX Take 1-2 at bedtime as needed for itch.   ibuprofen  600 MG tablet Commonly known as: ADVIL Take by mouth every 6 (six) hours as needed.   meloxicam 15 MG tablet Commonly known as: MOBIC Take 15 mg by mouth daily.   ondansetron 4 MG disintegrating tablet Commonly known as: ZOFRAN-ODT Take 1 tablet (4 mg total) by mouth every 8 (eight) hours as needed for nausea or vomiting.   tacrolimus 0.1 % ointment Commonly known as: PROTOPIC APPLY TO AFFECTED AREA  RASH EVERY NIGHT AT BEDTIME   Vtama 1 % Crea Generic drug: Tapinarof APPLY ONE APPLICATION TOPICALLY IN THE MORNING.        Allergies:  Allergies  Allergen Reactions   Hydrocortisone Butyrate    Balsam Other (See Comments)    Positive patch test   Benzyl Alcohol     Other reaction(s): Other (See Comments) Positive patch test   Nickel     Other reaction(s): Other (See Comments) Positive patch test   Other Rash    leather    Family History: Family History  Problem Relation Age of Onset   Hypertension Father    Hypertension Mother    Diabetes Father    Coronary artery disease Maternal Grandfather 34   Stroke Neg Hx    Cancer Mother        skin    Social History:  reports that he quit smoking about 24 years ago. His smoking use included cigarettes. His smokeless tobacco use includes snuff. He reports current alcohol use. He reports that he does not use drugs.   Physical Exam: BP 119/82   Pulse 92   Ht '5\' 8"'$  (1.727 m)   Wt 231 lb 9.6 oz (105.1 kg)   BMI 35.21 kg/m   Constitutional:  Alert and oriented, No acute distress.  Accompanied by his wife today. HEENT: [place] AT, moist mucus membranes.  Trachea midline, no masses. Cardiovascular: No clubbing, cyanosis, or edema. Respiratory: Normal respiratory effort, no increased work of breathing. Neurologic: Grossly intact, no focal deficits, moving all 4 extremities. Psychiatric: Normal mood and affect.  Laboratory Data: Lab Results  Component Value Date   WBC 12.1 (H) 02/25/2022   HGB 15.2 02/25/2022   HCT 45.1  02/25/2022   MCV 88.6 02/25/2022   PLT 236 02/25/2022    Lab Results  Component Value Date   CREATININE 1.30 (H) 02/25/2022     Urinalysis Urinalysis today is negative, no microscopic blood   Pertinent Imaging:  CT Renal Stone Study  Narrative CLINICAL DATA:  Flank pain, kidney stone suspected.  EXAM: CT ABDOMEN AND PELVIS WITHOUT CONTRAST  TECHNIQUE: Multidetector CT imaging of the abdomen and pelvis was performed following the standard protocol without IV contrast.  RADIATION DOSE REDUCTION: This exam was performed according to the departmental dose-optimization program which includes automated exposure control, adjustment of the mA and/or kV according to patient size and/or use  of iterative reconstruction technique.  COMPARISON:  CT abdomen pelvis dated December 12, 2021.  FINDINGS: Lower chest: Unchanged small pulmonary nodules measuring up to 4 mm in the left lower lobe on image 19/4.  Hepatobiliary: Diffuse hepatic steatosis with hepatomegaly. Focal fatty sparing along the gallbladder fossa. Gallbladder is distended without pericholecystic fluid or other evidence of inflammation. No biliary ductal dilation.  Pancreas: No pancreatic ductal dilation or evidence of acute inflammation.  Spleen: No splenomegaly.  Adrenals/Urinary Tract: Bilateral adrenal glands appear normal. Mild left hydroureteronephrosis to a 2-3 mm stone in the mid ureter at the L3-L4 vertebral body level. Urinary bladder is unremarkable for degree of distension.  Stomach/Bowel: No radiopaque enteric contrast material was administered. Stomach is unremarkable for degree of distension. No pathologic dilation of large or small bowel. Surgical clips along the cecum reflect appendectomy. No evidence of acute bowel inflammation. Scattered sigmoid colonic diverticulosis.  Vascular/Lymphatic: Normal caliber abdominal aorta. No pathologically enlarged abdominal or pelvic lymph  nodes.  Reproductive: Prostate is unremarkable.  Other: Small fat containing inguinal hernias.  Musculoskeletal: No acute or significant osseous findings.  IMPRESSION: 1. Mild left-sided hydroureteronephrosis to a 2-3 mm stone in the mid ureter at the L3-L4 vertebral body level. 2. Hepatomegaly with hepatic steatosis. 3. Scattered sigmoid colonic diverticulosis without findings of acute diverticulitis. 4. Unchanged small pulmonary nodules measuring up to 4 mm. No follow-up needed if patient is low-risk (and has no known or suspected primary neoplasm). Non-contrast chest CT can be considered in 12 months if patient is high-risk. This recommendation follows the consensus statement: Guidelines for Management of Incidental Pulmonary Nodules Detected on CT Images: From the Fleischner Society 2017; Radiology 2017; 284:228-243.   Electronically Signed By: Dahlia Bailiff M.D. On: 02/25/2022 11:46  The above CT scan imaging was personally reviewed and I agree with the radiologic interpretation.  Assessment & Plan:    1. Left ureteral stone 3 mm left ureteral calculus, strongly suspect interval either progression to the UVJ versus spontaneous passage especially in the setting of a negative urinalysis and no further pain  We discussed various treatment options for urolithiasis including observation with or without medical expulsive therapy, shockwave lithotripsy (SWL), ureteroscopy and laser lithotripsy with stent placement, and percutaneous nephrolithotomy.   We discussed that management is based on stone size, location, density, patient co-morbidities, and patient preference.    Stones <68m in size have a >80% spontaneous passage rate. Data surrounding the use of tamsulosin for medical expulsive therapy is controversial, but meta analyses suggests it is most efficacious for distal stones between 5-180min size. Possible side effects include dizziness/lightheadedness, and retrograde  ejaculation.   SWL has a lower stone free rate in a single procedure, but also a lower complication rate compared to ureteroscopy and avoids a stent and associated stent related symptoms. Possible complications include renal hematoma, steinstrasse, and need for additional treatment. We discussed the role of his increased skin to stone distance can lead to decreased efficacy with shockwave lithotripsy.   Ureteroscopy with laser lithotripsy and stent placement has a higher stone free rate than SWL in a single procedure, however increased complication rate including possible infection, ureteral injury, bleeding, and stent related morbidity. Common stent related symptoms include dysuria, urgency/frequency, and flank pain.   We will plan a KUB today.  He may consider ESWL if the stone is visible later this week versus continued medical expulsive therapy.  We also stone prevention both for uric acid stones as well as calcium-based stones.  He  was given stone prevention guide for both.  - Abdomen 1 view (KUB); Future  2. Urine frequency Infection, may be behaviorally related to fluid intake versus #1 - Urinalysis, Complete   We will call with KUB results   Hollice Espy, MD  [facility] [address] [place], [place] [address] [phone]  I spent 46 total minutes on the day of the encounter including pre-visit review of the medical record, face-to-face time with the patient, and post visit ordering of labs/imaging/tests.

## 2022-03-06 NOTE — Telephone Encounter (Addendum)
Patient informed, voice understanding.      ----- Message from Hollice Espy, MD sent at 03/06/2022  2:57 PM EDT ----- The stone is not able to be visualized on this x-ray.  Either it is passed or the other possibility is that it may be a uric acid stone and if that is the case, sometimes we cannot see them on plain film x-ray.  I would urge you to continue conservative management and let us know if your urinary symptoms fail to improve or if you have recurrent pain.  Otherwise, I think we can safely assume that the stone has passed.  Hollice Espy, MD

## 2022-05-08 ENCOUNTER — Ambulatory Visit: Payer: BC Managed Care – PPO | Admitting: Dermatology

## 2022-05-08 ENCOUNTER — Encounter: Payer: Self-pay | Admitting: Dermatology

## 2022-05-08 DIAGNOSIS — L409 Psoriasis, unspecified: Secondary | ICD-10-CM | POA: Diagnosis not present

## 2022-05-08 DIAGNOSIS — L309 Dermatitis, unspecified: Secondary | ICD-10-CM | POA: Diagnosis not present

## 2022-05-08 DIAGNOSIS — L2089 Other atopic dermatitis: Secondary | ICD-10-CM | POA: Diagnosis not present

## 2022-05-08 MED ORDER — TACROLIMUS 0.1 % EX OINT: TOPICAL_OINTMENT | CUTANEOUS | 2 refills | Status: DC

## 2022-05-08 MED ORDER — DESOXIMETASONE 0.25 % EX CREA: 1.0000 | TOPICAL_CREAM | Freq: Every day | CUTANEOUS | 2 refills | Status: DC

## 2022-05-08 MED ORDER — ADBRY 150 MG/ML ~~LOC~~ SOSY: 300.0000 mg | PREFILLED_SYRINGE | SUBCUTANEOUS | 5 refills | Status: DC

## 2022-05-08 MED ORDER — TRALOKINUMAB-LDRM 150 MG/ML ~~LOC~~ SOSY
300.0000 mg | PREFILLED_SYRINGE | Freq: Once | SUBCUTANEOUS | Status: AC
  Administered 2022-05-08: 300 mg via SUBCUTANEOUS

## 2022-05-08 NOTE — Addendum Note (Signed)
Addended by: Margarette Asal I on: 05/08/2022 10:17 AM   Modules accepted: Orders

## 2022-05-08 NOTE — Progress Notes (Addendum)
   Follow-Up Visit   Subjective  Dylan Oliver is a 46 y.o. male who presents for the following: Psoriasis (Patient here today for 1 year psoriasis and dermatitis follow up. Patient currently on [place] and is using tacrolimus).  Patient currently flared at buttocks.   The following portions of the chart were reviewed this encounter and updated as appropriate:   Tobacco  Allergies  Meds  Problems  Med Hx  Surg Hx  Fam Hx      Review of Systems:  No other skin or systemic complaints except as noted in HPI or Assessment and Plan.  Objective  Well appearing patient in no apparent distress; mood and affect are within normal limits.  A focused examination was performed including buttocks, feet, hands. Relevant physical exam findings are noted in the Assessment and Plan.  feet, buttocks Multiple scaly pink excoriated plaques at buttocks Nummular scaly pink plaques dorsal foot    Assessment & Plan  Dermatitis feet, buttocks  Chronic and persistent condition with duration over one year. Condition is bothersome/symptomatic for patient. Currently flared.  Nummular favored over psoriasis today though he has had exam consistent with both in the past  No joint pain  Patient uses medications until improved, stopped and within 1 week is flared.   D/c dupixent and start Adbry.  Start Adbry '150mg'$ /mL.  Samples injected today to left upper arm.  Lot # [id number]  Exp: 03/2023 Patient tolerated well.  Sample of Abdry given to patient to use in 2 weeks. Lot # [id number]  Exp: 08/2022  Restart desoximetasone twice a day as needed up to 2 weeks then weekends only. Avoid applying to face, groin, and axilla. Use as directed. Long-term use can cause thinning of the skin.  Start Zoryve daily, once calmed down may decrease to 2-3 times weekly. Samples given x 6 Lot # [id number]  Exp: 06/2023  Continue tacrolimus and eucrisa twice a day as needed  Consider bx if not responding to Zoryve or  Adbry  Topical steroids (such as triamcinolone, fluocinolone, fluocinonide, mometasone, clobetasol, halobetasol, betamethasone, hydrocortisone) can cause thinning and lightening of the skin if they are used for too long in the same area. Your physician has selected the right strength medicine for your problem and area affected on the body. Please use your medication only as directed by your physician to prevent side effects.    Tralokinumab-ldrm SOSY 300 mg - feet, buttocks   Tralokinumab-ldrm (ADBRY) 150 MG/ML SOSY - feet, buttocks Inject 2 mLs (300 mg total) into the skin every 14 (fourteen) days. Starting on day 15 for maintenance.  Other atopic dermatitis  Related Medications desoximetasone (TOPICORT) 0.25 % cream Apply 1 application topically daily. Daily as needed, weekends only. Avoid applying to face, groin, and axilla. Use as directed. Risk of skin atrophy with long-term use reviewed.  Psoriasis  Related Medications VTAMA 1 % CREA APPLY ONE APPLICATION TOPICALLY IN THE MORNING.  tacrolimus (PROTOPIC) 0.1 % ointment APPLY TO AFFECTED AREA  RASH EVERY NIGHT AT BEDTIME  F/u 2 months  Graciella Belton, RMA, am acting as scribe for Forest Gleason, MD .  Documentation: I have reviewed the above documentation for accuracy and completeness, and I agree with the above.  Forest Gleason, MD

## 2022-07-17 ENCOUNTER — Encounter: Payer: Self-pay | Admitting: Dermatology

## 2022-07-17 ENCOUNTER — Ambulatory Visit: Payer: BC Managed Care – PPO | Admitting: Dermatology

## 2022-07-17 VITALS — BP 105/67 | HR 78

## 2022-07-17 DIAGNOSIS — L309 Dermatitis, unspecified: Secondary | ICD-10-CM | POA: Diagnosis not present

## 2022-07-17 MED ORDER — OPZELURA 1.5 % EX CREA: TOPICAL_CREAM | CUTANEOUS | 2 refills | Status: AC

## 2022-07-17 NOTE — Patient Instructions (Addendum)
Recommend OTC Gold Bond Rapid Relief Anti-Itch cream (pramoxine + menthol), CeraVe Anti-itch cream or lotion (pramoxine), Sarna lotion (Original- menthol + camphor or Sensitive- pramoxine) or Eucerin 12 hour Itch Relief lotion (menthol) up to 3 times per day to areas on body that are itchy.   Restart dupixent restart on 15th of jan Start Opzelura samples -  use twice daily to affected areas of rash. Rx sent to [facility]  Continue tacrolimus and eucrisa twice a day as needed Continue desoximetasone twice a day as needed up to 2 weeks then weekends only. Avoid applying to face, groin, and axilla. Use as directed. Long-term use can cause thinning of the skin.  Topical steroids (such as triamcinolone, fluocinolone, fluocinonide, mometasone, clobetasol, halobetasol, betamethasone, hydrocortisone) can cause thinning and lightening of the skin if they are used for too long in the same area. Your physician has selected the right strength medicine for your problem and area affected on the body. Please use your medication only as directed by your physician to prevent side effects.   Your prescription was sent to [facility] in [place]. A representative from [facility] will contact you within 3 business hours to verify your address and insurance information to schedule a free delivery. If for any reason you do not receive a phone call from them, please reach out to them. Their phone number is [phone] and their hours are Monday-Friday 9:00 am-5:00 pm.        Due to recent changes in healthcare laws, you may see results of your pathology and/or laboratory studies on MyChart before the doctors have had a chance to review them. We understand that in some cases there may be results that are confusing or concerning to you. Please understand that not all results are received at the same time and often the doctors may need to interpret multiple results in order to provide you with the best plan of  care or course of treatment. Therefore, we ask that you please give [place] 2 business days to thoroughly review all your results before contacting the office for clarification. Should we see a critical lab result, you will be contacted sooner.   If You Need Anything After Your Visit  If you have any questions or concerns for your doctor, please call our main line at [phone] and press option 4 to reach your doctor's medical assistant. If no one answers, please leave a voicemail as directed and we will return your call as soon as possible. Messages left after 4 pm will be answered the following business day.   You may also send [place] a message via [place]. We typically respond to MyChart messages within 1-2 business days.  For prescription refills, please ask your pharmacy to contact our office. Our fax number is [phone].  If you have an urgent issue when the clinic is closed that cannot wait until the next business day, you can page your doctor at the number below.    Please note that while we do our best to be available for urgent issues outside of office hours, we are not available 24/7.   If you have an urgent issue and are unable to reach [place], you may choose to seek medical care at your doctor's office, retail clinic, urgent care center, or emergency room.  If you have a medical emergency, please immediately call 911 or go to the emergency department.  Pager Numbers  - Dr. Nehemiah Massed: [phone]  - Dr. Laurence Ferrari: [phone]  - Dr. Nicole Kindred: [phone]  In the event of inclement weather, please call our main line at [phone] for an update on the status of any delays or closures.  Dermatology Medication Tips: Please keep the boxes that topical medications come in in order to help keep track of the instructions about where and how to use these. Pharmacies typically print the medication instructions only on the boxes and not directly on the medication tubes.   If your medication is  too expensive, please contact our office at [phone] option 4 or send [place] a message through [place].   We are unable to tell what your co-pay for medications will be in advance as this is different depending on your insurance coverage. However, we may be able to find a substitute medication at lower cost or fill out paperwork to get insurance to cover a needed medication.   If a prior authorization is required to get your medication covered by your insurance company, please allow [place] 1-2 business days to complete this process.  Drug prices often vary depending on where the prescription is filled and some pharmacies may offer cheaper prices.  The website www.goodrx.com contains coupons for medications through different pharmacies. The prices here do not account for what the cost may be with help from insurance (it may be cheaper with your insurance), but the website can give you the price if you did not use any insurance.  - You can print the associated coupon and take it with your prescription to the pharmacy.  - You may also stop by our office during regular business hours and pick up a GoodRx coupon card.  - If you need your prescription sent electronically to a different pharmacy, notify our office through [facility] or by phone at [phone] option 4.     Si Usted Necesita Algo Despus de Su Visita  Tambin puede enviarnos un mensaje a travs de Pharmacist, community. Por lo general respondemos a los mensajes de MyChart en el transcurso de 1 a 2 das hbiles.  Para renovar recetas, por favor pida a su farmacia que se ponga en contacto con nuestra oficina. Harland Dingwall de fax es [place] [phone].  Si tiene un asunto urgente cuando la clnica est cerrada y que no puede esperar hasta el siguiente da hbil, puede llamar/localizar a su doctor(a) al nmero que aparece a continuacin.   Por favor, tenga en cuenta que aunque hacemos todo lo posible para estar disponibles para asuntos urgentes  fuera del horario de [place], no estamos disponibles las 24 horas del da, los 7 das de la [place].   Si tiene un problema urgente y no puede comunicarse con nosotros, puede optar por buscar atencin mdica  en el consultorio de su doctor(a), en una clnica privada, en un centro de atencin urgente o en una sala de emergencias.  Si tiene Engineering geologist, por favor llame inmediatamente al 911 o vaya a la sala de emergencias.  Nmeros de bper  - Dr. Nehemiah Massed: [phone]  - Dra. Moye: [phone]  - Dra. Nicole Kindred: [phone]  En caso de inclemencias del [place], por favor llame a Johnsie Kindred principal al [phone] para una actualizacin sobre el [place] de cualquier retraso o cierre.  Consejos para la medicacin en dermatologa: Por favor, guarde las cajas en las que vienen los medicamentos de uso tpico para ayudarle a seguir las instrucciones sobre dnde y cmo usarlos. Las farmacias generalmente imprimen las instrucciones del medicamento slo en las cajas y no directamente en los tubos del [place].  Si su medicamento es muy caro, por favor, pngase en contacto con Zigmund Daniel llamando al [phone] y presione la opcin 4 o envenos un mensaje a travs de Pharmacist, community.   No podemos decirle cul ser su copago por los medicamentos por adelantado ya que esto es diferente dependiendo de la cobertura de su seguro. Sin embargo, es posible que podamos encontrar un medicamento sustituto a Electrical engineer un formulario para que el seguro cubra el medicamento que se considera necesario.   Si se requiere una autorizacin previa para que su compaa de seguros [place] su medicamento, por favor permtanos de 1 a 2 das hbiles para completar este proceso.  Los precios de los medicamentos varan con frecuencia dependiendo del Environmental consultant de dnde se surte la receta y alguna farmacias pueden ofrecer precios ms baratos.  El sitio web www.goodrx.com tiene cupones para medicamentos de  Airline pilot. Los precios aqu no tienen en cuenta lo que podra costar con la ayuda del seguro (puede ser ms barato con su seguro), pero el sitio web puede darle el precio si no utiliz Research scientist (physical sciences).  - Puede imprimir el cupn correspondiente y llevarlo con su receta a la farmacia.  - Tambin puede pasar por nuestra oficina durante el horario de atencin regular y Charity fundraiser una tarjeta de cupones de GoodRx.  - Si necesita que su receta se enve electrnicamente a una farmacia diferente, informe a nuestra oficina a travs de MyChart de [place] o por telfono llamando al [phone] y presione la opcin 4.

## 2022-07-17 NOTE — Progress Notes (Signed)
   Follow-Up Visit   Subjective  Dylan Oliver is a 47 y.o. male who presents for the following: Dermatitis (2 month follow up. Hx at buttocks and feet. Patient tried Mikal Plane but states that flared at legs after adbry injection but will not go away. Patient reports nothing has helped he is flared today at buttocks and legs. ).  The following portions of the chart were reviewed this encounter and updated as appropriate:  Tobacco  Allergies  Meds  Problems  Med Hx  Surg Hx  Fam Hx      Review of Systems: No other skin or systemic complaints except as noted in HPI or Assessment and Plan.   Objective  Well appearing patient in no apparent distress; mood and affect are within normal limits.  A focused examination was performed including buttocks and legs. Relevant physical exam findings are noted in the Assessment and Plan.  buttocks and b/l feet Scaly pink plaques buttock, legs   Assessment & Plan  Dermatitis buttocks and b/l feet  Chronic and persistent condition with duration over one year. Condition is bothersome/symptomatic for patient. Currently flared.   Nummular dermatitis favored over psoriasis today though he has had exam consistent with both in the past   No hx of joint pain Reports hx of atrial fibrillation or something similar Failed topical steriods tacrolimus and adbry  Had dramatic improvement with dupixent in past , but just has not completely cleared    D/c  Adbry last injection Jan 1 D/c Zoryve  Discussed Rinvoq -  will hold due to heart issues  Discussed Discussed potential risks/benefits of JAK inhibitors (Rinvoq/Cibinqo) which are new oral biologic medications used for treating severe, refractory atopic dermatitis.  They are very effective in resolving itchy eczema rashes of the skin.  Overall potential side effects are very minimal and mild and they are much safer than oral steroids. The black box warning for the JAK inhibitor class was discussed,  including rare cardiovascular effects, thrombosis, and serious bacterial and viral infections, such as TB or herpes zoster, although these more severe side effects have not been seen with Rinvoq or Cibinqo in their safety trials.  Baseline lab tests are done prior to initiation of medication to ensure pt has no underlying lab abnormalities, and then rechecked at 3 months.  If normal, then periodic doctor visits and annual labs are performed for long term medication management.  Restart dupixent on 15th   Start Opzelura samples -  use twice daily to affected areas of rash. Do not use to more than 10% of skin. 2 samples given lot [id number] Exp July 2024 31  Continue tacrolimus and eucrisa twice a day as needed  Continue desoximetasone twice a day as needed up to 2 weeks then weekends only. Avoid applying to face, groin, and axilla. Use as directed. Long-term use can cause thinning of the skin.   Ruxolitinib Phosphate (OPZELURA) 1.5 % CREA - buttocks and b/l feet Apply topically to affected areas of rash twice daily   Return in about 3 months (around 10/16/2022) for dermatitis follow up.  I, Ruthell Rummage, CMA, am acting as scribe for Forest Gleason, MD.  Documentation: I have reviewed the above documentation for accuracy and completeness, and I agree with the above.  Forest Gleason, MD

## 2022-07-27 ENCOUNTER — Encounter: Payer: Self-pay | Admitting: Dermatology

## 2022-10-09 ENCOUNTER — Ambulatory Visit: Payer: BC Managed Care – PPO | Admitting: Dermatology

## 2022-10-09 ENCOUNTER — Encounter: Payer: Self-pay | Admitting: Dermatology

## 2022-10-09 VITALS — BP 105/67

## 2022-10-09 DIAGNOSIS — L2089 Other atopic dermatitis: Secondary | ICD-10-CM

## 2022-10-09 DIAGNOSIS — Z79899 Other long term (current) drug therapy: Secondary | ICD-10-CM

## 2022-10-09 NOTE — Progress Notes (Signed)
   Follow-Up Visit   Subjective  Dylan Oliver is a 47 y.o. male who presents for the following: 3 month dermatitis follow up. Hx at buttocks and feet. Patient restarted Dupixent injections once monthly and is only using tacrolimus as needed, desoximetasone as needed. Patient advises he is clear other than a small area at buttocks.   The following portions of the chart were reviewed this encounter and updated as appropriate: medications, allergies, medical history  Review of Systems:  No other skin or systemic complaints except as noted in HPI or Assessment and Plan.  Objective  Well appearing patient in no apparent distress; mood and affect are within normal limits.   A focused examination was performed of the following areas: Buttocks and low back  Relevant exam findings are noted in the Assessment and Plan.    Assessment & Plan   Dermatitis  Chronic and persistent condition with duration over one year. Condition is bothersome/symptomatic for patient.   Exam: Lichenified plaque at left buttock  Treatment Plan: Continue tacrolimus and desoximetasone as needed.  Recommend Opzelura twice a day as needed for up to 10% of skin  Did discuss this will likely recur off [place] and may take some time to get under control.  Patient will let us know if he would like [place] to send in rx for Opzelura to use at less then 10% of skin.   No hx of joint pain Reports hx of atrial fibrillation or something similar Failed adbry  Well controlled on Dupixent + tacrolimus Patient would like to do a medication holiday and see how he does without Dupixent.  Defer Rinvoq and Cibinqo due to clotting risk in the setting of Afib  Dupilumab (Dupixent) is a treatment given by injection for adults and children with moderate-to-severe atopic dermatitis. Goal is control of skin condition, not cure. It is given as 2 injections at the first dose followed by 1 injection ever 2 weeks thereafter.  Young  children are dosed monthly.  Potential side effects include allergic reaction, herpes infections, injection site reactions and conjunctivitis (inflammation of the eyes).  The use of Dupixent requires long term medication management, including periodic office visits.  Topical steroids (such as triamcinolone, fluocinolone, fluocinonide, mometasone, clobetasol, halobetasol, betamethasone, hydrocortisone) can cause thinning and lightening of the skin if they are used for too long in the same area. Your physician has selected the right strength medicine for your problem and area affected on the body. Please use your medication only as directed by your physician to prevent side effects.    Return in about 6 months (around 04/10/2023) for Dermatitis.  Graciella Belton, RMA, am acting as scribe for Forest Gleason, MD .   Documentation: I have reviewed the above documentation for accuracy and completeness, and I agree with the above.  Forest Gleason, MD

## 2022-10-09 NOTE — Patient Instructions (Signed)
Continue tacrolimus and desoximetasone as needed.  Can consider Opzelura for smaller areas if needed. Did discuss this may recur off Dupixent and may take some time to get under control.  Patient will let us know if he would like [place] to send in rx for Opzelura to use at less then 10% of skin.   Topical steroids (such as triamcinolone, fluocinolone, fluocinonide, mometasone, clobetasol, halobetasol, betamethasone, hydrocortisone) can cause thinning and lightening of the skin if they are used for too long in the same area. Your physician has selected the right strength medicine for your problem and area affected on the body. Please use your medication only as directed by your physician to prevent side effects.   Due to recent changes in healthcare laws, you may see results of your pathology and/or laboratory studies on MyChart before the doctors have had a chance to review them. We understand that in some cases there may be results that are confusing or concerning to you. Please understand that not all results are received at the same time and often the doctors may need to interpret multiple results in order to provide you with the best plan of care or course of treatment. Therefore, we ask that you please give [place] 2 business days to thoroughly review all your results before contacting the office for clarification. Should we see a critical lab result, you will be contacted sooner.   If You Need Anything After Your Visit  If you have any questions or concerns for your doctor, please call our main line at [phone] and press option 4 to reach your doctor's medical assistant. If no one answers, please leave a voicemail as directed and we will return your call as soon as possible. Messages left after 4 pm will be answered the following business day.   You may also send [place] a message via [place]. We typically respond to MyChart messages within 1-2 business days.  For prescription refills, please ask your  pharmacy to contact our office. Our fax number is [phone].  If you have an urgent issue when the clinic is closed that cannot wait until the next business day, you can page your doctor at the number below.    Please note that while we do our best to be available for urgent issues outside of office hours, we are not available 24/7.   If you have an urgent issue and are unable to reach [place], you may choose to seek medical care at your doctor's office, retail clinic, urgent care center, or emergency room.  If you have a medical emergency, please immediately call 911 or go to the emergency department.  Pager Numbers  - Dr. Nehemiah Massed: [phone]  - Dr. Laurence Ferrari: [phone]  - Dr. Nicole Kindred: [phone]  In the event of inclement weather, please call our main line at [phone] for an update on the status of any delays or closures.  Dermatology Medication Tips: Please keep the boxes that topical medications come in in order to help keep track of the instructions about where and how to use these. Pharmacies typically print the medication instructions only on the boxes and not directly on the medication tubes.   If your medication is too expensive, please contact our office at [phone] option 4 or send [place] a message through [place].   We are unable to tell what your co-pay for medications will be in advance as this is different depending on your insurance coverage. However, we may be able to find a substitute medication  at lower cost or fill out paperwork to get insurance to cover a needed medication.   If a prior authorization is required to get your medication covered by your insurance company, please allow [place] 1-2 business days to complete this process.  Drug prices often vary depending on where the prescription is filled and some pharmacies may offer cheaper prices.  The website www.goodrx.com contains coupons for medications through different pharmacies. The prices here do not  account for what the cost may be with help from insurance (it may be cheaper with your insurance), but the website can give you the price if you did not use any insurance.  - You can print the associated coupon and take it with your prescription to the pharmacy.  - You may also stop by our office during regular business hours and pick up a GoodRx coupon card.  - If you need your prescription sent electronically to a different pharmacy, notify our office through [facility] or by phone at [phone] option 4.     Si Usted Necesita Algo Despus de Su Visita  Tambin puede enviarnos un mensaje a travs de Pharmacist, community. Por lo general respondemos a los mensajes de MyChart en el transcurso de 1 a 2 das hbiles.  Para renovar recetas, por favor pida a su farmacia que se ponga en contacto con nuestra oficina. Harland Dingwall de fax es [place] [phone].  Si tiene un asunto urgente cuando la clnica est cerrada y que no puede esperar hasta el siguiente da hbil, puede llamar/localizar a su doctor(a) al nmero que aparece a continuacin.   Por favor, tenga en cuenta que aunque hacemos todo lo posible para estar disponibles para asuntos urgentes fuera del horario de [place], no estamos disponibles las 24 horas del da, los 7 das de la [place].   Si tiene un problema urgente y no puede comunicarse con nosotros, puede optar por buscar atencin mdica  en el consultorio de su doctor(a), en una clnica privada, en un centro de atencin urgente o en una sala de emergencias.  Si tiene Engineering geologist, por favor llame inmediatamente al 911 o vaya a la sala de emergencias.  Nmeros de bper  - Dr. Nehemiah Massed: [phone]  - Dra. Moye: [phone]  - Dra. Nicole Kindred: [phone]  En caso de inclemencias del [place], por favor llame a Johnsie Kindred principal al [phone] para una actualizacin sobre el [place] de cualquier retraso o cierre.  Consejos para la medicacin en dermatologa: Por  favor, guarde las cajas en las que vienen los medicamentos de uso tpico para ayudarle a seguir las instrucciones sobre dnde y cmo usarlos. Las farmacias generalmente imprimen las instrucciones del medicamento slo en las cajas y no directamente en los tubos del [place].   Si su medicamento es muy caro, por favor, pngase en contacto con Zigmund Daniel llamando al [phone] y presione la opcin 4 o envenos un mensaje a travs de Pharmacist, community.   No podemos decirle cul ser su copago por los medicamentos por adelantado ya que esto es diferente dependiendo de la cobertura de su seguro. Sin embargo, es posible que podamos encontrar un medicamento sustituto a Electrical engineer un formulario para que el seguro cubra el medicamento que se considera necesario.   Si se requiere una autorizacin previa para que su compaa de seguros [place] su medicamento, por favor permtanos de 1 a 2 das hbiles para completar este proceso.  Los precios de los medicamentos varan con frecuencia dependiendo del Environmental consultant de dnde  se surte la receta y alguna farmacias pueden ofrecer precios ms baratos.  El sitio web www.goodrx.com tiene cupones para medicamentos de Airline pilot. Los precios aqu no tienen en cuenta lo que podra costar con la ayuda del seguro (puede ser ms barato con su seguro), pero el sitio web puede darle el precio si no utiliz Research scientist (physical sciences).  - Puede imprimir el cupn correspondiente y llevarlo con su receta a la farmacia.  - Tambin puede pasar por nuestra oficina durante el horario de atencin regular y Charity fundraiser una tarjeta de cupones de GoodRx.  - Si necesita que su receta se enve electrnicamente a una farmacia diferente, informe a nuestra oficina a travs de MyChart de [place] o por telfono llamando al [phone] y presione la opcin 4.

## 2023-01-15 ENCOUNTER — Encounter: Payer: Self-pay | Admitting: Dermatology

## 2023-01-21 MED ORDER — DUPIXENT 300 MG/2ML ~~LOC~~ SOAJ: 300.0000 mg | SUBCUTANEOUS | 1 refills | Status: DC

## 2023-01-21 MED ORDER — DUPIXENT 300 MG/2ML ~~LOC~~ SOAJ: 600.0000 mg | Freq: Once | SUBCUTANEOUS | 0 refills | Status: AC

## 2023-02-04 NOTE — Telephone Encounter (Signed)
Called patient and [facility] is holding prescription due to insurance issue. I will work on updated PA and follow up with patient by end of week. aw

## 2023-02-20 ENCOUNTER — Other Ambulatory Visit: Payer: Self-pay

## 2023-02-20 MED ORDER — DUPIXENT 300 MG/2ML ~~LOC~~ SOAJ: 300.0000 mg | SUBCUTANEOUS | 1 refills | Status: DC

## 2023-04-07 ENCOUNTER — Ambulatory Visit: Payer: BC Managed Care – PPO | Admitting: Dermatology

## 2023-04-07 ENCOUNTER — Encounter: Payer: Self-pay | Admitting: Dermatology

## 2023-04-07 DIAGNOSIS — L209 Atopic dermatitis, unspecified: Secondary | ICD-10-CM | POA: Diagnosis not present

## 2023-04-07 DIAGNOSIS — Z79899 Other long term (current) drug therapy: Secondary | ICD-10-CM | POA: Diagnosis not present

## 2023-04-07 DIAGNOSIS — W57XXXA Bitten or stung by nonvenomous insect and other nonvenomous arthropods, initial encounter: Secondary | ICD-10-CM | POA: Diagnosis not present

## 2023-04-07 DIAGNOSIS — S50861A Insect bite (nonvenomous) of right forearm, initial encounter: Secondary | ICD-10-CM

## 2023-04-07 DIAGNOSIS — Z7189 Other specified counseling: Secondary | ICD-10-CM

## 2023-04-07 NOTE — Patient Instructions (Signed)

## 2023-04-07 NOTE — Progress Notes (Signed)
   Follow-Up Visit   Subjective  Dylan Oliver is a 47 y.o. male who presents for the following: Atopic Dermatitis, itching controlled with some mild redness at buttocks and left lower leg. No side effects on Dupixent. Patient was worse when on Adbry.  Patient back on Dupixent and uses tacrolimus and desoximetasone as needed.   The following portions of the chart were reviewed this encounter and updated as appropriate: medications, allergies, medical history  Review of Systems:  No other skin or systemic complaints except as noted in HPI or Assessment and Plan.  Objective  Well appearing patient in no apparent distress; mood and affect are within normal limits.  Areas Examined: Arms, legs  Relevant physical exam findings are noted in the Assessment and Plan.  Right Forearm Pink papule    Assessment & Plan   Reaction to insect bite Right Forearm  Patient advises tick was on him less than 24 hours.   No joint pain, no fever. Patient will let us know if he develops fever, consider doxycycline.     ATOPIC DERMATITIS Exam: mild erythematous scaly patch of left lower leg. 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 Currently well-controlled.   Atopic dermatitis - Severe, on Dupixent (biologic medication).  Atopic dermatitis (eczema) is a chronic, relapsing, pruritic condition that can significantly affect quality of life. It is often associated with allergic rhinitis and/or asthma and can require  treatment with topical medications, phototherapy, or in severe cases biologic medications, which require long term medication management.    Treatment Plan:  Continue Dupixent 300 mg/94mL SQ QOW. Patient denies side effects. Patient self-injecting at home.  Continue tacrolimus or desoximetasone BID PRN for flares  Potential side effects include allergic reaction, herpes infections, injection site reactions and conjunctivitis (inflammation of the eyes).  The use of Dupixent requires long term medication management, including periodic office visits.    Long term medication management.  Patient is using long term (months to years) prescription medication  to control their dermatologic condition.  These medications require periodic monitoring to evaluate for efficacy and side effects and may require periodic laboratory monitoring.   Recommend gentle skin care.   Return in about 10 months (around 02/04/2024) for Dupixent.  Anise Salvo, RMA, am acting as scribe for Elie Goody, MD .   Documentation: I have reviewed the above documentation for accuracy and completeness, and I agree with the above.  Elie Goody, MD

## 2023-04-10 ENCOUNTER — Ambulatory Visit: Payer: BC Managed Care – PPO | Admitting: Dermatology

## 2023-04-28 ENCOUNTER — Other Ambulatory Visit: Payer: Self-pay | Admitting: Dermatology

## 2023-06-25 ENCOUNTER — Other Ambulatory Visit: Payer: Self-pay | Admitting: Dermatology

## 2023-12-11 ENCOUNTER — Other Ambulatory Visit: Payer: Self-pay | Admitting: Dermatology

## 2024-01-26 ENCOUNTER — Encounter: Payer: Self-pay | Admitting: Dermatology

## 2024-01-26 ENCOUNTER — Ambulatory Visit: Payer: BC Managed Care – PPO | Admitting: Dermatology

## 2024-01-26 DIAGNOSIS — Z79899 Other long term (current) drug therapy: Secondary | ICD-10-CM | POA: Diagnosis not present

## 2024-01-26 DIAGNOSIS — R21 Rash and other nonspecific skin eruption: Secondary | ICD-10-CM

## 2024-01-26 DIAGNOSIS — L209 Atopic dermatitis, unspecified: Secondary | ICD-10-CM

## 2024-01-26 DIAGNOSIS — Z7189 Other specified counseling: Secondary | ICD-10-CM

## 2024-01-26 DIAGNOSIS — L308 Other specified dermatitis: Secondary | ICD-10-CM

## 2024-01-26 DIAGNOSIS — L280 Lichen simplex chronicus: Secondary | ICD-10-CM

## 2024-01-26 DIAGNOSIS — B86 Scabies: Secondary | ICD-10-CM

## 2024-01-26 DIAGNOSIS — L821 Other seborrheic keratosis: Secondary | ICD-10-CM

## 2024-01-26 DIAGNOSIS — L2089 Other atopic dermatitis: Secondary | ICD-10-CM

## 2024-01-26 MED ORDER — IVERMECTIN 3 MG PO TABS: ORAL_TABLET | ORAL | 1 refills | Status: DC

## 2024-01-26 MED ORDER — DUPIXENT 300 MG/2ML ~~LOC~~ SOAJ: SUBCUTANEOUS | 5 refills | Status: AC

## 2024-01-26 NOTE — Patient Instructions (Addendum)
 Wound Care Instructions  Cleanse wound gently with soap and water once a day then pat dry with clean gauze. Apply a thin coat of Petrolatum (petroleum jelly, Vaseline) over the wound (unless you have an allergy to this). We recommend that you use a new, sterile tube of Vaseline. Do not pick or remove scabs. Do not remove the yellow or white healing tissue from the base of the wound.  Cover the wound with fresh, clean, nonstick gauze and secure with paper tape. You may use Band-Aids in place of gauze and tape if the wound is small enough, but would recommend trimming much of the tape off as there is often too much. Sometimes Band-Aids can irritate the skin.  You should call the office for your biopsy report after 1 week if you have not already been contacted.  If you experience any problems, such as abnormal amounts of bleeding, swelling, significant bruising, significant pain, or evidence of infection, please call the office immediately.  FOR ADULT SURGERY PATIENTS: If you need something for pain relief you may take 1 extra strength Tylenol  (acetaminophen ) AND 2 Ibuprofen (200mg  each) together every 4 hours as needed for pain. (do not take these if you are allergic to them or if you have a reason you should not take them.) Typically, you may only need pain medication for 1 to 3 days.   Ivermectin  3 mg take 7 tablets once. Repeat in 14 days   Continue Dupixent  300 mg/2 ml every 10 days.   Continue Eucrisa  and Desoximetasone  twice a day as needed for flares.   Topical steroids (such as triamcinolone , fluocinolone, fluocinonide, mometasone, clobetasol, halobetasol, betamethasone, hydrocortisone) can cause thinning and lightening of the skin if they are used for too long in the same area. Your physician has selected the right strength medicine for your problem and area affected on the body. Please use your medication only as directed by your physician to prevent side effects.    Gentle Skin Care  Guide  1. Bathe no more than once a day.  2. Avoid bathing in hot water  3. Use a mild soap like Dove, Vanicream, Cetaphil, CeraVe. Can use Lever 2000 or Cetaphil antibacterial soap  4. Use soap only where you need it. On most days, use it under your arms, between your legs, and on your feet. Let the water rinse other areas unless visibly dirty.  5. When you get out of the bath/shower, use a towel to gently blot your skin dry, don't rub it.  6. While your skin is still a little damp, apply a moisturizing cream such as Vanicream, CeraVe, Cetaphil, Eucerin, Sarna lotion or plain Vaseline Jelly. For hands apply Neutrogena [place] Hand Cream or Excipial Hand Cream.  7. Reapply moisturizer any time you start to itch or feel dry.  8. Sometimes using free and clear laundry detergents can be helpful. Fabric softener sheets should be avoided. Downy Free & Gentle liquid, or any liquid fabric softener that is free of dyes and perfumes, it acceptable to use  9. If your doctor has given you prescription creams you may apply moisturizers over them      Due to recent changes in healthcare laws, you may see results of your pathology and/or laboratory studies on MyChart before the doctors have had a chance to review them. We understand that in some cases there may be results that are confusing or concerning to you. Please understand that not all results are received at the same time and  often the doctors may need to interpret multiple results in order to provide you with the best plan of care or course of treatment. Therefore, we ask that you please give us  2 business days to thoroughly review all your results before contacting the office for clarification. Should we see a critical lab result, you will be contacted sooner.   If You Need Anything After Your Visit  If you have any questions or concerns for your doctor, please call our main line at [phone] and press option 4 to reach your doctor's  medical assistant. If no one answers, please leave a voicemail as directed and we will return your call as soon as possible. Messages left after 4 pm will be answered the following business day.   You may also send us  a message via MyChart. We typically respond to MyChart messages within 1-2 business days.  For prescription refills, please ask your pharmacy to contact our office. Our fax number is [phone].  If you have an urgent issue when the clinic is closed that cannot wait until the next business day, you can page your doctor at the number below.    Please note that while we do our best to be available for urgent issues outside of office hours, we are not available 24/7.   If you have an urgent issue and are unable to reach us , you may choose to seek medical care at your doctor's office, retail clinic, urgent care center, or emergency room.  If you have a medical emergency, please immediately call 911 or go to the emergency department.  Pager Numbers  - Dr. Hester: [phone]  - Dr. Jackquline: [phone]  - Dr. Claudene: [phone]   In the event of inclement weather, please call our main line at [phone] for an update on the status of any delays or closures.  Dermatology Medication Tips: Please keep the boxes that topical medications come in in order to help keep track of the instructions about where and how to use these. Pharmacies typically print the medication instructions only on the boxes and not directly on the medication tubes.   If your medication is too expensive, please contact our office at [phone] option 4 or send us  a message through MyChart.   We are unable to tell what your co-pay for medications will be in advance as this is different depending on your insurance coverage. However, we may be able to find a substitute medication at lower cost or fill out paperwork to get insurance to cover a needed medication.   If a prior authorization is required  to get your medication covered by your insurance company, please allow us  1-2 business days to complete this process.  Drug prices often vary depending on where the prescription is filled and some pharmacies may offer cheaper prices.  The website www.goodrx.com contains coupons for medications through different pharmacies. The prices here do not account for what the cost may be with help from insurance (it may be cheaper with your insurance), but the website can give you the price if you did not use any insurance.  - You can print the associated coupon and take it with your prescription to the pharmacy.  - You may also stop by our office during regular business hours and pick up a GoodRx coupon card.  - If you need your prescription sent electronically to a different pharmacy, notify our office through [facility] or by phone at [phone] option 4.     Si  Usted Necesita Algo Despus de Su Visita  Tambin puede enviarnos un mensaje a travs de Clinical cytogeneticist. Por lo general respondemos a los mensajes de MyChart en el transcurso de 1 a 2 das hbiles.  Para renovar recetas, por favor pida a su farmacia que se ponga en contacto con nuestra oficina. Randi lakes de fax es [place] [phone].  Si tiene un asunto urgente cuando la clnica est cerrada y que no puede esperar hasta el siguiente da hbil, puede llamar/localizar a su doctor(a) al nmero que aparece a continuacin.   Por favor, tenga en cuenta que aunque hacemos todo lo posible para estar disponibles para asuntos urgentes fuera del horario de [place], no estamos disponibles las 24 horas del da, los 7 [address]  [address] de la [place].   Si tiene un problema urgente y no puede comunicarse con nosotros, puede optar por buscar atencin mdica  en el consultorio de su doctor(a), en una clnica privada, en un centro de atencin urgente o en una sala de emergencias.  Si tiene Engineer, drilling, por favor llame inmediatamente al 911 o vaya a la sala  de emergencias.  Nmeros de bper  - Dr. Hester: [phone]  - Dra. Jackquline: [phone]  - Dr. Claudene: [phone]   En caso de inclemencias del tiempo, por favor llame a landry capes principal al [phone] para una actualizacin sobre el [place] de cualquier retraso o cierre.  Consejos para la medicacin en dermatologa: Por favor, guarde las cajas en las que vienen los medicamentos de uso tpico para ayudarle a seguir las instrucciones sobre dnde y cmo usarlos. Las farmacias generalmente imprimen las instrucciones del medicamento slo en las cajas y no directamente en los tubos del [place].   Si su medicamento es muy caro, por favor, pngase en contacto con landry rieger llamando al [phone] y presione la opcin 4 o envenos un mensaje a travs de Clinical cytogeneticist.   No podemos decirle cul ser su copago por los medicamentos por adelantado ya que esto es diferente dependiendo de la cobertura de su seguro. Sin embargo, es posible que podamos encontrar un medicamento sustituto a Audiological scientist un formulario para que el seguro cubra el medicamento que se considera necesario.   Si se requiere una autorizacin previa para que su compaa de seguros malta su medicamento, por favor permtanos de 1 a 2 das hbiles para completar este proceso.  Los precios de los medicamentos varan con frecuencia dependiendo del Environmental consultant de dnde se surte la receta y alguna farmacias pueden ofrecer precios ms baratos.  El sitio web www.goodrx.com tiene cupones para medicamentos de Health and safety inspector. Los precios aqu no tienen en cuenta lo que podra costar con la ayuda del seguro (puede ser ms barato con su seguro), pero el sitio web puede darle el precio si no utiliz Tourist information centre manager.  - Puede imprimir el cupn correspondiente y llevarlo con su receta a la farmacia.  - Tambin puede pasar por nuestra oficina durante el horario de atencin regular y Education officer, museum una tarjeta de cupones de GoodRx.  -  Si necesita que su receta se enve electrnicamente a una farmacia diferente, informe a nuestra oficina a travs de MyChart de [place] o por telfono llamando al [phone] y presione la opcin 4.

## 2024-01-26 NOTE — Progress Notes (Signed)
 Follow-Up Visit   Subjective  Dylan Oliver is a 48 y.o. male who presents for the following: Atopic Dermatitis. On Dupixent . Flaring x 2 weeks. Dupixent  not helping as much but thinks it does help overall. Has been taking every 10 days instead of 14. Usually flares in summer months. Neck, arms, buttocks, chest/nipple, foot. Sweats a lot during summer so does not use creams, ruins clothing. Itching all the time. Will be doing another Dupixent  injection today.  Wife woke up today with a rash, thinks secondary to insect bite.   Using Eucrisa  and desoximetasone  as needed.   The following portions of the chart were reviewed this encounter and updated as appropriate: medications, allergies, medical history  Review of Systems:  No other skin or systemic complaints except as noted in HPI or Assessment and Plan.  Objective  Well appearing patient in no apparent distress; mood and affect are within normal limits.  Areas Examined: Arms, legs  Relevant physical exam findings are noted in the Assessment and Plan.  Neck - Posterior Erythematous scaly papules confluent to plaques at posterior neck (biopsied). Scaly pink patch occipital scalp.Lichenified pink plaque left superior buttock. Erythematous excoriated papules and plaques b/l buttocks. Circular scaly pink plaque L leg    Assessment & Plan   SEBORRHEIC KERATOSIS - Stuck-on, waxy, tan-brown papule at right parietal scalp - Benign-appearing - Discussed benign etiology and prognosis. - Observe - Call for any changes    RASH Neck - Posterior Ivermectin  3 mg take 7 tablets once. Repeat in 14 days    Skin / nail biopsy - Neck - Posterior Type of biopsy: tangential   Informed consent: discussed and consent obtained   Timeout: patient name, date of birth, surgical site, and procedure verified   Procedure prep:  Patient was prepped and draped in usual sterile fashion Prep type:  Isopropyl alcohol Anesthesia: the lesion was  anesthetized in a standard fashion   Anesthetic:  1% lidocaine w/ epinephrine 1-100,000 buffered w/ 8.4% NaHCO3 Instrument used: DermaBlade   Hemostasis achieved with: pressure and aluminum chloride   Outcome: patient tolerated procedure well   Post-procedure details: sterile dressing applied and wound care instructions given   Dressing type: bandage and petrolatum    ivermectin  (STROMECTOL ) 3 MG TABS tablet - Neck - Posterior Take 7 tablets once then 7 tablets again 14 days later Specimen 1 - Surgical pathology Differential Diagnosis: intrinsic eczema vs allergic contact dermatitis vs mycosis fungoides vs lichen planus vs scabies vs urticaria vs subacute cutaneous lupus vs chronic actinic dermatitis  Check Margins: No OTHER ATOPIC DERMATITIS   Related Medications desoximetasone  (TOPICORT ) 0.25 % cream Apply 1 Application topically daily. apply twice daily as needed to affected areas for two weeks then apply twice daily on weekends only. Avoid applying to face, groin, and axilla. Use as directed. Long-term use can cause thinning of the skin. Dupilumab  (DUPIXENT ) 300 MG/2ML SOAJ Inject 1 pen subcutaneously every 14 days LONG-TERM USE OF HIGH-RISK MEDICATION   COUNSELING AND COORDINATION OF CARE   MEDICATION MANAGEMENT   SEBORRHEIC KERATOSES   SCABIES   Related Medications ivermectin  (STROMECTOL ) 3 MG TABS tablet Take 7 tablets once then 7 tablets again 14 days later    ATOPIC DERMATITIS, failed tacrolimus  opzelura  desoximetasone  adbry . Improved with but flaring despite Dupixent . Not at goal Exam: mild erythematous scaly patch of left lower leg. Erythematous scaly papules confluent to plaques at posterior neck. Patient reports lesions on left thigh and right buttock as well. Hands/finger webs clear.  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  Patient states he skipped some injections during the past winter so has some extra pens to continue to inject every 10 days.   Defer Rinvoq and Cibinqo due to clotting risk in the setting of Afib  Chronic and persistent condition with duration or expected duration over one year. Condition is improving with treatment but not currently at goal.   Atopic dermatitis - Severe, on Dupixent  (biologic medication).  Atopic dermatitis (eczema) is a chronic, relapsing, pruritic condition that can significantly affect quality of life. It is often associated with allergic rhinitis and/or asthma and can require treatment with topical medications, phototherapy, or in severe cases biologic medications, which require long term medication management.    Treatment Plan: Repeat biopsy to check if rash is still atopic dermatitis Continue Dupixent  300 mg/2mL SQ Q10 days. Patient denies side effects. Patient self-injecting at home.  Continue Eucrisa  or desoximetasone  BID PRN for flares  Discussed concern for occult scabies in case of recalcitrant atopic dermatitis involving buttock area. SE ivermectin  include headache GI upset. Trial of ivermectin  200 mcg/kg (105 kg) once then 14 days later Potential side effects include allergic reaction, herpes infections, injection site reactions and conjunctivitis (inflammation of the eyes).  The use of Dupixent  requires long term medication management, including periodic office visits.    Long term medication management.  Patient is using long term (months to years) prescription medication  to control their dermatologic condition.  These medications require periodic monitoring to evaluate for efficacy and side effects and may require periodic laboratory monitoring.   Recommend gentle skin care.    Return in about 3 months (around 04/27/2024) for Atopic Dermatitis Follow Up.  I, Kate Fought, CMA, am acting as scribe  for Boneta Sharps, MD.    Documentation: I have reviewed the above documentation for accuracy and completeness, and I agree with the above.  Boneta Sharps, MD

## 2024-01-27 ENCOUNTER — Telehealth: Payer: Self-pay

## 2024-01-27 LAB — SURGICAL PATHOLOGY

## 2024-01-27 NOTE — Telephone Encounter (Signed)
 Please call patient and scheduled him a new patient appointment with Dr. Cleatus.    Copied from CRM [id number]. Topic: Appointments - Scheduling Inquiry for Clinic >> Jan 26, 2024  3:30 PM Aisha D wrote: Reason for CRM: Pt stated that his dad Nilesh Stegall currently is a pt of Dr.Duncan and would like to see if he could become a pt of Dr.Duncan's as well. Pt would like a callback with an update.    ----------------------------------------------------------------------- From previous Reason for Contact - Scheduling: Patient/patient representative is calling to schedule an appointment. Refer to attachments for appointment information.

## 2024-01-30 ENCOUNTER — Encounter: Payer: Self-pay | Admitting: Dermatology

## 2024-01-30 ENCOUNTER — Ambulatory Visit: Payer: Self-pay | Admitting: Dermatology

## 2024-02-20 ENCOUNTER — Ambulatory Visit: Admitting: Family Medicine

## 2024-02-27 ENCOUNTER — Encounter: Payer: Self-pay | Admitting: Family Medicine

## 2024-02-27 ENCOUNTER — Ambulatory Visit (INDEPENDENT_AMBULATORY_CARE_PROVIDER_SITE_OTHER): Admitting: Family Medicine

## 2024-02-27 VITALS — BP 138/78 | HR 74 | Temp 98.6°F | Ht 68.7 in | Wt 238.2 lb

## 2024-02-27 DIAGNOSIS — Z0000 Encounter for general adult medical examination without abnormal findings: Secondary | ICD-10-CM

## 2024-02-27 DIAGNOSIS — E785 Hyperlipidemia, unspecified: Secondary | ICD-10-CM

## 2024-02-27 DIAGNOSIS — E781 Pure hyperglyceridemia: Secondary | ICD-10-CM | POA: Diagnosis not present

## 2024-02-27 DIAGNOSIS — L309 Dermatitis, unspecified: Secondary | ICD-10-CM

## 2024-02-27 DIAGNOSIS — M109 Gout, unspecified: Secondary | ICD-10-CM | POA: Diagnosis not present

## 2024-02-27 DIAGNOSIS — Z7189 Other specified counseling: Secondary | ICD-10-CM

## 2024-02-27 DIAGNOSIS — Z23 Encounter for immunization: Secondary | ICD-10-CM | POA: Diagnosis not present

## 2024-02-27 DIAGNOSIS — R002 Palpitations: Secondary | ICD-10-CM

## 2024-02-27 DIAGNOSIS — G43909 Migraine, unspecified, not intractable, without status migrainosus: Secondary | ICD-10-CM

## 2024-02-27 DIAGNOSIS — M2550 Pain in unspecified joint: Secondary | ICD-10-CM

## 2024-02-27 LAB — COMPREHENSIVE METABOLIC PANEL WITH GFR
ALT: 52 U/L (ref 0–53)
AST: 26 U/L (ref 0–37)
Albumin: 4.4 g/dL (ref 3.5–5.2)
Alkaline Phosphatase: 50 U/L (ref 39–117)
BUN: 11 mg/dL (ref 6–23)
CO2: 26 meq/L (ref 19–32)
Calcium: 8.7 mg/dL (ref 8.4–10.5)
Chloride: 107 meq/L (ref 96–112)
Creatinine, Ser: 0.9 mg/dL (ref 0.40–1.50)
GFR: 102.05 mL/min (ref >60.00)
Glucose, Bld: 67 mg/dL — ABNORMAL LOW (ref 70–99)
Potassium: 4.4 meq/L (ref 3.5–5.1)
Sodium: 141 meq/L (ref 135–145)
Total Bilirubin: 0.5 mg/dL (ref 0.2–1.2)
Total Protein: 6.9 g/dL (ref 6.0–8.3)

## 2024-02-27 LAB — LIPID PANEL
Cholesterol: 159 mg/dL (ref 0–200)
HDL: 29.5 mg/dL — ABNORMAL LOW (ref >39.00)
LDL Cholesterol: 84 mg/dL (ref 0–99)
NonHDL: 129.22
Total CHOL/HDL Ratio: 5
Triglycerides: 225 mg/dL — ABNORMAL HIGH (ref 0.0–149.0)
VLDL: 45 mg/dL — ABNORMAL HIGH (ref 0.0–40.0)

## 2024-02-27 LAB — CBC WITH DIFFERENTIAL/PLATELET
Basophils Absolute: 0 K/uL (ref 0.0–0.1)
Basophils Relative: 0.5 % (ref 0.0–3.0)
Eosinophils Absolute: 0.1 K/uL (ref 0.0–0.7)
Eosinophils Relative: 0.9 % (ref 0.0–5.0)
HCT: 46 % (ref 39.0–52.0)
Hemoglobin: 15.7 g/dL (ref 13.0–17.0)
Lymphocytes Relative: 27.4 % (ref 12.0–46.0)
Lymphs Abs: 2.3 K/uL (ref 0.7–4.0)
MCHC: 34.1 g/dL (ref 30.0–36.0)
MCV: 88.7 fl (ref 78.0–100.0)
Monocytes Absolute: 0.9 K/uL (ref 0.1–1.0)
Monocytes Relative: 11.5 % (ref 3.0–12.0)
Neutro Abs: 4.9 K/uL (ref 1.4–7.7)
Neutrophils Relative %: 59.7 % (ref 43.0–77.0)
Platelets: 248 K/uL (ref 150.0–400.0)
RBC: 5.19 Mil/uL (ref 4.22–5.81)
RDW: 12.9 % (ref 11.5–15.5)
WBC: 8.2 K/uL (ref 4.0–10.5)

## 2024-02-27 LAB — URIC ACID: Uric Acid, Serum: 8.2 mg/dL — ABNORMAL HIGH (ref 4.0–7.8)

## 2024-02-27 MED ORDER — COLCHICINE 0.6 MG PO TABS
0.6000 mg | ORAL_TABLET | Freq: Every day | ORAL | 1 refills | Status: DC | PRN
Start: 2024-02-27 — End: 2024-04-28

## 2024-02-27 MED ORDER — DESOXIMETASONE 0.25 % EX CREA: 1.0000 | TOPICAL_CREAM | Freq: Two times a day (BID) | CUTANEOUS | Status: AC | PRN

## 2024-02-27 NOTE — Progress Notes (Signed)
 CPE- See plan.  Routine anticipatory guidance given to patient.  See health maintenance.  The possibility exists that previously documented standard health maintenance information may have been brought forward from a previous encounter into this note.  If needed, that same information has been updated to reflect the current situation based on today's encounter.    Tetanus shot 2025 Routine vaccines d/w pt.   PSA not due, d/w pt.  Living will d/w pt.  Wife designated if patient were incapacitated.   Diet and exercise d/w pt.   D/w patient mz:neupnwd for colon cancer screening, including IFOB vs. colonoscopy.  Risks and benefits of both were discussed and patient voiced understanding.  Pt elects to consider colonoscopy vs colougard.  He can let me know which way he wants to go.  Gout flare ~1/year.  Uses colchicine  as needed.  Does not have chronic symptoms otherwise.  Colchicine  usually helps when needed.  Diet and exercise discussed with patient.  Eczema.  Followed by outside clinic.  Overall improved with Dupixent .  Using topical treatment as needed.  H/o PVCs but better than prior, with dec in stressors.    History of hyperlipidemia.  See notes on labs.  H/o migraines but not as much as prev.   Taking meloxicam for shoulder pain, then aknkle pain.  No pain now with med use.   Nsaid cautions d/w pt.   PMH and SH reviewed  Meds, vitals, and allergies reviewed.   ROS: Per HPI.  Unless specifically indicated otherwise in HPI, the patient denies:  General: fever. Eyes: acute vision changes ENT: sore throat Cardiovascular: chest pain Respiratory: SOB GI: vomiting GU: dysuria Musculoskeletal: acute back pain Derm: acute rash Neuro: acute motor dysfunction Psych: worsening mood Endocrine: polydipsia Heme: bleeding Allergy: hayfever  GEN: nad, alert and oriented HEENT: mucous membranes moist NECK: supple w/o LA CV: rrr. PULM: ctab, no inc wob ABD: soft, +bs EXT: no  edema SKIN: chronic eczema changes on the B buttock.

## 2024-02-27 NOTE — Patient Instructions (Addendum)
 Tetanus shot today.  Go to the lab on the way out.   If you have mychart we'll likely use that to update you.    Take care.  Glad to see you. I would get a flu shot each fall.

## 2024-02-29 ENCOUNTER — Ambulatory Visit: Payer: Self-pay | Admitting: Family Medicine

## 2024-02-29 DIAGNOSIS — Z0000 Encounter for general adult medical examination without abnormal findings: Secondary | ICD-10-CM | POA: Insufficient documentation

## 2024-02-29 DIAGNOSIS — G43909 Migraine, unspecified, not intractable, without status migrainosus: Secondary | ICD-10-CM | POA: Insufficient documentation

## 2024-02-29 DIAGNOSIS — M2550 Pain in unspecified joint: Secondary | ICD-10-CM | POA: Insufficient documentation

## 2024-02-29 DIAGNOSIS — Z7189 Other specified counseling: Secondary | ICD-10-CM | POA: Insufficient documentation

## 2024-02-29 NOTE — Assessment & Plan Note (Signed)
 History of, fortunately not as frequent now.  I asked him to update me as needed.

## 2024-02-29 NOTE — Assessment & Plan Note (Signed)
 Multiple, shoulder and ankle pain.  Improved with meloxicam.  NSAID cautions discussed with patient.  Discussed trying to taper NSAIDs to see how he felt.

## 2024-02-29 NOTE — Assessment & Plan Note (Signed)
 Living will d/w pt.  Wife designated if patient were incapacitated.   ?

## 2024-02-29 NOTE — Assessment & Plan Note (Signed)
 Followed by outside clinic.  Overall improved with Dupixent .  Using topical treatment as needed.

## 2024-02-29 NOTE — Assessment & Plan Note (Signed)
 Tetanus shot 2025 Routine vaccines d/w pt.   PSA not due, d/w pt.  Living will d/w pt.  Wife designated if patient were incapacitated.   Diet and exercise d/w pt.   D/w patient mz:neupnwd for colon cancer screening, including IFOB vs. colonoscopy.  Risks and benefits of both were discussed and patient voiced understanding.  Pt elects to consider colonoscopy vs colougard.

## 2024-02-29 NOTE — Assessment & Plan Note (Signed)
 Diet and exercise discussed.  Continue as needed colchicine .  See notes on labs.

## 2024-02-29 NOTE — Assessment & Plan Note (Signed)
Discussed diet and exercise.  See notes on labs. 

## 2024-02-29 NOTE — Assessment & Plan Note (Signed)
 H/o PVCs but better than prior, with dec in stressors.

## 2024-03-23 ENCOUNTER — Other Ambulatory Visit: Payer: Self-pay | Admitting: Family Medicine

## 2024-04-26 ENCOUNTER — Ambulatory Visit: Admitting: Dermatology

## 2024-04-27 ENCOUNTER — Other Ambulatory Visit: Payer: Self-pay | Admitting: Family Medicine

## 2024-04-27 DIAGNOSIS — M109 Gout, unspecified: Secondary | ICD-10-CM

## 2024-04-27 NOTE — Telephone Encounter (Signed)
 Colchicine  Last filled:  03/28/24, #30 Last OV:  02/27/24, CPE Next OV:  none

## 2024-07-12 DIAGNOSIS — L2089 Other atopic dermatitis: Secondary | ICD-10-CM

## 2024-07-26 ENCOUNTER — Other Ambulatory Visit: Payer: Self-pay | Admitting: Dermatology

## 2024-07-26 DIAGNOSIS — L2089 Other atopic dermatitis: Secondary | ICD-10-CM
